# Patient Record
Sex: Male | Born: 1985 | Race: Black or African American | Hispanic: No | Marital: Single | Smoking: Former smoker
Health system: Southern US, Community
[De-identification: ages and names within clinical notes are randomized; demographics above are authoritative.]

## PROBLEM LIST (undated history)

## (undated) DIAGNOSIS — F1910 Other psychoactive substance abuse, uncomplicated: Secondary | ICD-10-CM

## (undated) HISTORY — PX: EYE SURGERY: SHX253

---
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
## 2003-05-23 ENCOUNTER — Emergency Department (HOSPITAL_COMMUNITY): Admission: EM | Admit: 2003-05-23 | Discharge: 2003-05-23 | Payer: Self-pay | Admitting: Emergency Medicine

## 2003-06-01 ENCOUNTER — Emergency Department (HOSPITAL_COMMUNITY): Admission: AD | Admit: 2003-06-01 | Discharge: 2003-06-01 | Payer: Self-pay | Admitting: Family Medicine

## 2003-07-23 ENCOUNTER — Emergency Department (HOSPITAL_COMMUNITY): Admission: EM | Admit: 2003-07-23 | Discharge: 2003-07-23 | Payer: Self-pay | Admitting: Emergency Medicine

## 2003-10-12 ENCOUNTER — Emergency Department (HOSPITAL_COMMUNITY): Admission: EM | Admit: 2003-10-12 | Discharge: 2003-10-12 | Payer: Self-pay | Admitting: Emergency Medicine

## 2003-11-02 ENCOUNTER — Emergency Department (HOSPITAL_COMMUNITY): Admission: EM | Admit: 2003-11-02 | Discharge: 2003-11-02 | Payer: Self-pay | Admitting: Family Medicine

## 2003-12-25 ENCOUNTER — Emergency Department (HOSPITAL_COMMUNITY): Admission: EM | Admit: 2003-12-25 | Discharge: 2003-12-25 | Payer: Self-pay | Admitting: Family Medicine

## 2004-02-15 ENCOUNTER — Emergency Department (HOSPITAL_COMMUNITY): Admission: EM | Admit: 2004-02-15 | Discharge: 2004-02-15 | Payer: Self-pay | Admitting: Family Medicine

## 2005-04-13 ENCOUNTER — Emergency Department (HOSPITAL_COMMUNITY): Admission: EM | Admit: 2005-04-13 | Discharge: 2005-04-13 | Payer: Self-pay | Admitting: Family Medicine

## 2005-07-01 ENCOUNTER — Inpatient Hospital Stay (HOSPITAL_COMMUNITY): Admission: AC | Admit: 2005-07-01 | Discharge: 2005-07-08 | Payer: Self-pay

## 2005-07-13 ENCOUNTER — Emergency Department (HOSPITAL_COMMUNITY): Admission: EM | Admit: 2005-07-13 | Discharge: 2005-07-14 | Payer: Self-pay | Admitting: Emergency Medicine

## 2005-07-21 ENCOUNTER — Emergency Department (HOSPITAL_COMMUNITY): Admission: EM | Admit: 2005-07-21 | Discharge: 2005-07-21 | Payer: Self-pay | Admitting: Family Medicine

## 2005-07-22 ENCOUNTER — Inpatient Hospital Stay (HOSPITAL_COMMUNITY): Admission: EM | Admit: 2005-07-22 | Discharge: 2005-07-30 | Payer: Self-pay | Admitting: Emergency Medicine

## 2005-08-09 ENCOUNTER — Encounter: Admission: RE | Admit: 2005-08-09 | Discharge: 2005-08-09 | Payer: Self-pay | Admitting: Thoracic Surgery

## 2007-07-01 IMAGING — CR DG CHEST 2V
2 series · 2 of 2 positions shown · non-contrast
Comparison: 07/14/05.

CLINICAL DATA: Short of breath. Chest pain. Difficulty breathing.
 CHEST - 2 VIEW:

[w chest pa]
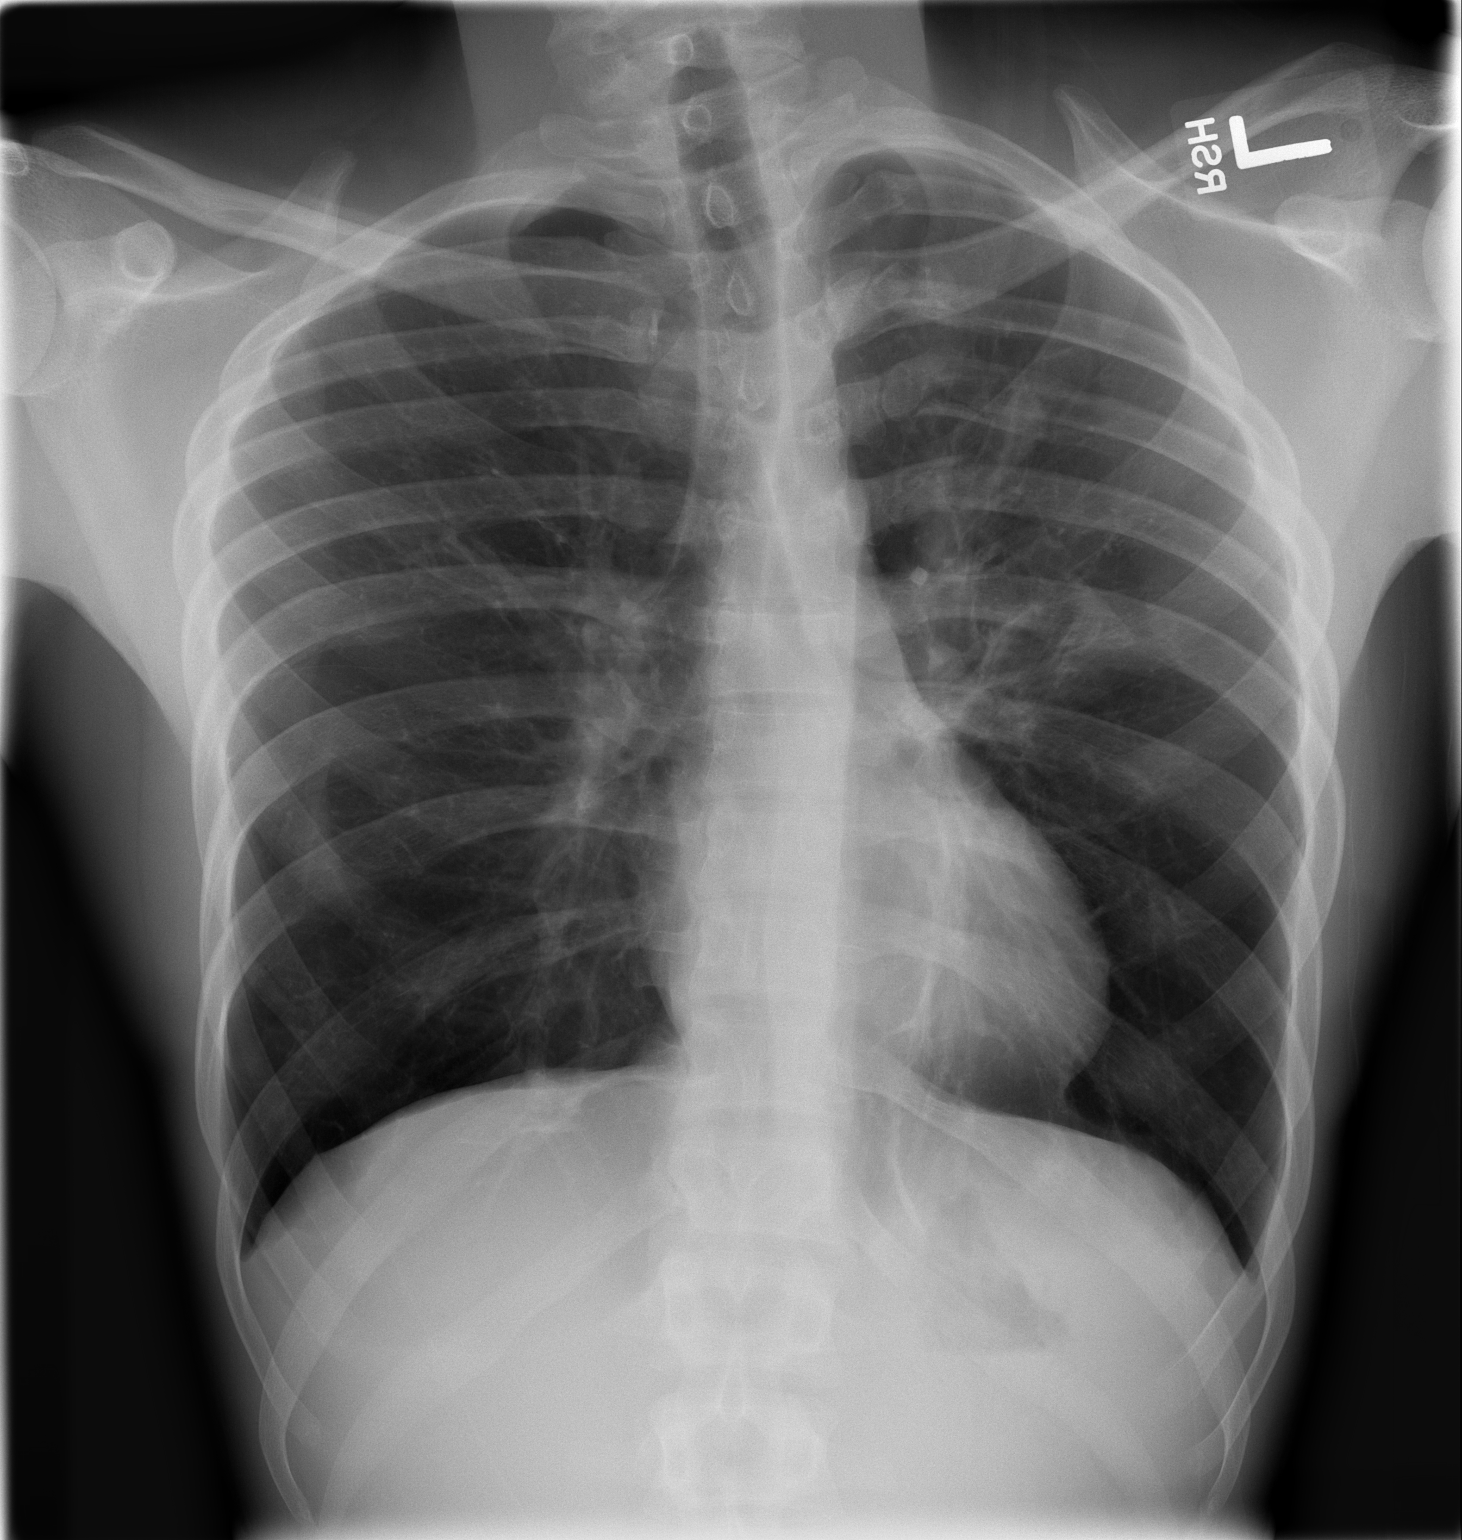

[w chest lat]
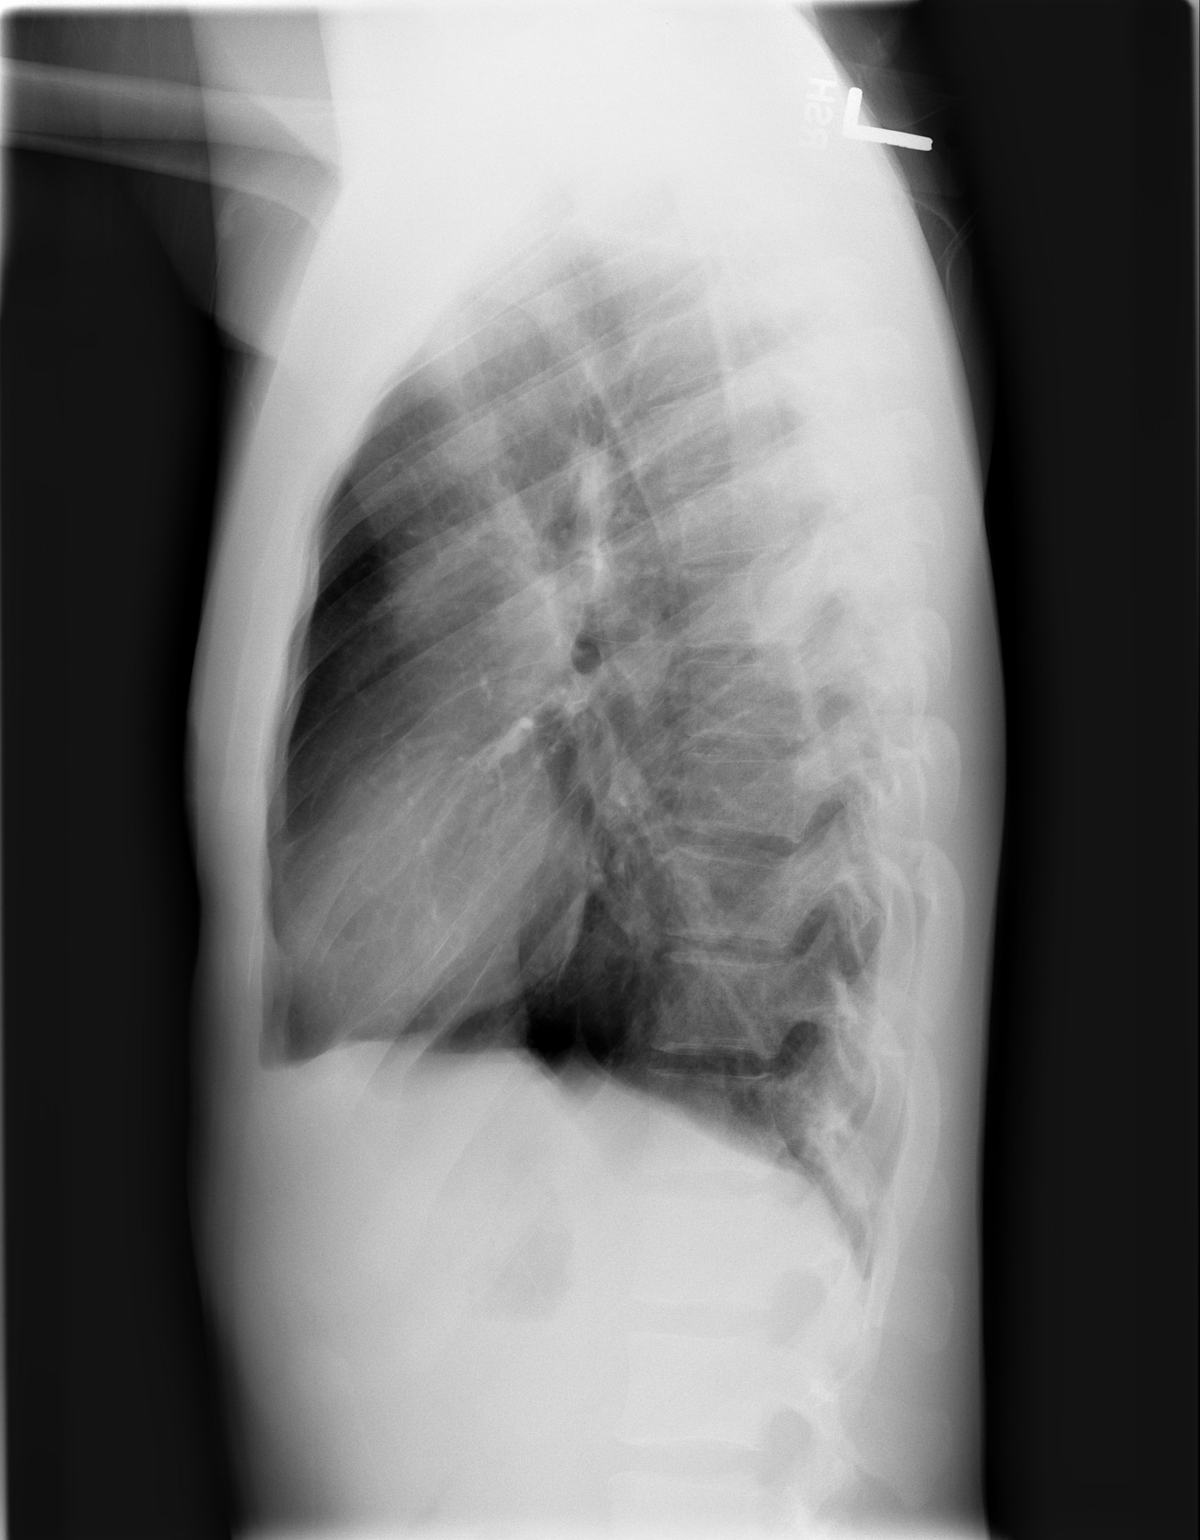

[2 of 2 positions shown; findings below may reference images not displayed]

FINDINGS: A cavitary density in the superior segment of the left lower lobe has improved and was likely an area of pneumonia.  There also is interval clearing of a small area of right lower lobe infiltrate.  There is chronic lung disease with findings suggestive of asthma.  No effusion is seen.
IMPRESSION: Clearing of right lower lobe pneumonia.
 Partial clearing of left lower lobe cavitary pneumonia.  Continued follow-up until clearing is suggested.

## 2008-01-24 ENCOUNTER — Inpatient Hospital Stay (HOSPITAL_COMMUNITY): Admission: EM | Admit: 2008-01-24 | Discharge: 2008-01-26 | Payer: Self-pay | Admitting: *Deleted

## 2008-04-11 ENCOUNTER — Emergency Department (HOSPITAL_COMMUNITY): Admission: EM | Admit: 2008-04-11 | Discharge: 2008-04-11 | Payer: Self-pay | Admitting: Emergency Medicine
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
## 2011-02-19 ENCOUNTER — Emergency Department (HOSPITAL_COMMUNITY)
Admission: EM | Admit: 2011-02-19 | Discharge: 2011-02-20 | Disposition: A | Discharge status: Home / Self Care | Payer: No Typology Code available for payment source | Attending: Emergency Medicine | Admitting: Emergency Medicine

## 2011-02-19 DIAGNOSIS — R079 Chest pain, unspecified: Secondary | ICD-10-CM | POA: Insufficient documentation

## 2011-02-19 DIAGNOSIS — T148XXA Other injury of unspecified body region, initial encounter: Secondary | ICD-10-CM | POA: Insufficient documentation

## 2011-02-19 DIAGNOSIS — Y92410 Unspecified street and highway as the place of occurrence of the external cause: Secondary | ICD-10-CM | POA: Insufficient documentation

## 2011-02-19 DIAGNOSIS — M542 Cervicalgia: Secondary | ICD-10-CM | POA: Insufficient documentation

## 2011-02-19 DIAGNOSIS — M25519 Pain in unspecified shoulder: Secondary | ICD-10-CM | POA: Insufficient documentation

## 2011-02-20 ENCOUNTER — Emergency Department (HOSPITAL_COMMUNITY): Payer: No Typology Code available for payment source

## 2011-02-20 ENCOUNTER — Emergency Department (HOSPITAL_COMMUNITY)
Admission: EM | Admit: 2011-02-20 | Discharge: 2011-02-20 | Disposition: A | Discharge status: Home / Self Care | Payer: No Typology Code available for payment source | Attending: Emergency Medicine | Admitting: Emergency Medicine

## 2011-02-20 ENCOUNTER — Emergency Department (HOSPITAL_COMMUNITY): Payer: Self-pay

## 2011-02-20 DIAGNOSIS — R51 Headache: Secondary | ICD-10-CM | POA: Insufficient documentation

## 2011-02-20 DIAGNOSIS — M545 Low back pain, unspecified: Secondary | ICD-10-CM | POA: Insufficient documentation

## 2011-02-20 DIAGNOSIS — Z09 Encounter for follow-up examination after completed treatment for conditions other than malignant neoplasm: Secondary | ICD-10-CM | POA: Insufficient documentation

## 2011-02-20 DIAGNOSIS — S335XXA Sprain of ligaments of lumbar spine, initial encounter: Secondary | ICD-10-CM | POA: Insufficient documentation

## 2011-02-22 LAB — GC/CHLAMYDIA PROBE AMP, GENITAL
Chlamydia, DNA Probe: NEGATIVE
GC Probe Amp, Genital: NEGATIVE

## 2011-02-23 LAB — POCT I-STAT, CHEM 8
Calcium, Ion: 1.08 — ABNORMAL LOW
Chloride: 103
Creatinine, Ser: 1.1
Potassium: 3.1 — ABNORMAL LOW

## 2011-02-23 LAB — DIFFERENTIAL
Basophils Absolute: 0
Basophils Relative: 0
Eosinophils Absolute: 0.3
Eosinophils Relative: 4
Lymphocytes Relative: 14
Monocytes Absolute: 0.4
Monocytes Relative: 6

## 2011-02-23 LAB — EXPECTORATED SPUTUM ASSESSMENT W GRAM STAIN, RFLX TO RESP C

## 2011-02-23 LAB — RAPID URINE DRUG SCREEN, HOSP PERFORMED
Benzodiazepines: NOT DETECTED
Cocaine: NOT DETECTED
Opiates: NOT DETECTED

## 2011-02-23 LAB — CULTURE, BLOOD (ROUTINE X 2): Culture: NO GROWTH

## 2011-02-23 LAB — COMPREHENSIVE METABOLIC PANEL
AST: 30
Albumin: 3.5
BUN: 12
Creatinine, Ser: 0.9
GFR calc Af Amer: >60
Potassium: 4.3
Total Protein: 6.6

## 2011-02-23 LAB — CBC
Hemoglobin: 14.8
Hemoglobin: 16.5
Platelets: 118 — ABNORMAL LOW
RBC: 5.03
RBC: 5.55
WBC: 6.4

## 2011-02-23 LAB — URINALYSIS, MICROSCOPIC ONLY
Glucose, UA: NEGATIVE
Leukocytes, UA: NEGATIVE
Specific Gravity, Urine: 1.028
Urobilinogen, UA: 0.2
pH: 7

## 2011-02-23 LAB — CULTURE, RESPIRATORY W GRAM STAIN: Culture: NORMAL

## 2011-02-23 LAB — LEGIONELLA ANTIGEN, URINE: Legionella Antigen, Urine: NEGATIVE

## 2011-02-23 LAB — URINE CULTURE: Colony Count: NO GROWTH

## 2011-08-25 ENCOUNTER — Emergency Department (INDEPENDENT_AMBULATORY_CARE_PROVIDER_SITE_OTHER)
Admission: EM | Admit: 2011-08-25 | Discharge: 2011-08-25 | Disposition: A | Discharge status: Home Health | Payer: Self-pay | Source: Home / Self Care | Attending: Emergency Medicine | Admitting: Emergency Medicine

## 2011-08-25 ENCOUNTER — Encounter (HOSPITAL_COMMUNITY): Payer: Self-pay | Admitting: *Deleted

## 2011-08-25 DIAGNOSIS — L03039 Cellulitis of unspecified toe: Secondary | ICD-10-CM

## 2011-08-25 MED ORDER — DOXYCYCLINE HYCLATE 100 MG PO CAPS: 100.0000 mg | ORAL_CAPSULE | Freq: Two times a day (BID) | ORAL | Status: AC

## 2011-08-25 NOTE — Discharge Instructions (Signed)
  As discussed for the next 24-48 hours soak affected toe in soapy luke water (use antibacterial soaps such as fever or dial) no improvement after that and tenderness remains the same or worsen start with provided antibiotic    Paronychia Paronychia is an inflammatory reaction involving the folds of the skin surrounding the fingernail. This is commonly caused by an infection in the skin around a nail. The most common cause of paronychia is frequent wetting of the hands (as seen with bartenders, food servers, nurses or others who wet their hands). This makes the skin around the fingernail susceptible to infection by bacteria (germs) or fungus. Other predisposing factors are:  Aggressive manicuring.   Nail biting.   Thumb sucking.  The most common cause is a staphylococcal (a type of germ) infection, or a fungal (Candida) infection. When caused by a germ, it usually comes on suddenly with redness, swelling, pus and is often painful. It may get under the nail and form an abscess (collection of pus), or form an abscess around the nail. If the nail itself is infected with a fungus, the treatment is usually prolonged and may require oral medicine for up to one year. Your caregiver will determine the length of time treatment is required. The paronychia caused by bacteria (germs) may largely be avoided by not pulling on hangnails or picking at cuticles. When the infection occurs at the tips of the finger it is called felon. When the cause of paronychia is from the herpes simplex virus (HSV) it is called herpetic whitlow. TREATMENT  When an abscess is present treatment is often incision and drainage. This means that the abscess must be cut open so the pus can get out. When this is done, the following home care instructions should be followed. HOME CARE INSTRUCTIONS   It is important to keep the affected fingers very dry. Rubber or plastic gloves over cotton gloves should be used whenever the hand must be  placed in water.   Keep wound clean, dry and dressed as suggested by your caregiver between warm soaks or warm compresses.   Soak in warm water for fifteen to twenty minutes three to four times per day for bacterial infections. Fungal infections are very difficult to treat, so often require treatment for long periods of time.   For bacterial (germ) infections take antibiotics (medicine which kill germs) as directed and finish the prescription, even if the problem appears to be solved before the medicine is gone.   Only take over-the-counter or prescription medicines for pain, discomfort, or fever as directed by your caregiver.  SEEK IMMEDIATE MEDICAL CARE IF:  You have redness, swelling, or increasing pain in the wound.   You notice pus coming from the wound.   You have a fever.   You notice a bad smell coming from the wound or dressing.  Document Released: 11/02/2000 Document Revised: 04/28/2011 Document Reviewed: 07/04/2008 [facility] Patient Information 2012 [place], [place].

## 2011-08-25 NOTE — ED Notes (Signed)
Pt reports 2 days of pain in his left great toe.  He used a toenail clippers to try to work on an ingrown nail

## 2011-08-25 NOTE — ED Provider Notes (Signed)
History     CSN: [id number]  Arrival date & time 08/25/11  1002   First MD Initiated Contact with Patient 08/25/11 1043      Chief Complaint  Patient presents with  . Toe Pain    (Consider location/radiation/quality/duration/timing/severity/associated sxs/prior treatment) HPI Comments: I have had an ingrown toenail on left great toe for a week or so I did use some clippers and did get being called part of the nail out. But it got infected a sore red and with discharge and is hurting.  Patient is a 26 y.o. male presenting with toe pain. The history is provided by the patient.  Toe Pain This is a new problem. The current episode started 2 days ago. The problem occurs constantly. The problem has not changed since onset.Pertinent negatives include no abdominal pain and no headaches.    Past Medical History  Diagnosis Date  . Asthma     History reviewed. No pertinent past surgical history.  History reviewed. No pertinent family history.  History  Substance Use Topics  . Smoking status: Former Smoker    Quit date: 09/17/2008  . Smokeless tobacco: Not on file  . Alcohol Use: No      Review of Systems  Constitutional: Negative for fever.  Gastrointestinal: Negative for abdominal pain.  Skin: Positive for wound. Negative for rash.  Neurological: Negative for syncope, numbness and headaches.    Allergies  Review of patient's allergies indicates no known allergies.  Home Medications   Current Outpatient Rx  Name Route Sig Dispense Refill  . ALBUTEROL SULFATE HFA 108 (90 BASE) MCG/ACT IN AERS Inhalation Inhale 2 puffs into the lungs every 6 (six) hours as needed.    Marland Kitchen DOXYCYCLINE HYCLATE 100 MG PO CAPS Oral Take 1 capsule (100 mg total) by mouth 2 (two) times daily. 20 capsule 0    BP 117/59  Pulse 76  Temp(Src) 97.1 F (36.2 C) (Oral)  Resp 12  SpO2 96%  Physical Exam  Vitals reviewed. Constitutional: He appears well-developed and well-nourished.  Eyes:  Conjunctivae are normal.  Abdominal: There is tenderness.  Musculoskeletal: He exhibits tenderness.       Feet:  Neurological: He is alert.  Skin: Skin is warm. Rash noted. There is erythema.    ED Course  Procedures (including critical care time)  Labs Reviewed - No data to display No results found.   1. Paronychia of toe       MDM  Self resolve toe ingrown with a secondarily infected subungueal       Jimmie Molly, MD 08/25/11 1121

## 2011-09-12 ENCOUNTER — Emergency Department (HOSPITAL_COMMUNITY)
Admission: EM | Admit: 2011-09-12 | Discharge: 2011-09-12 | Disposition: A | Discharge status: Home / Self Care | Payer: Self-pay | Attending: Emergency Medicine | Admitting: Emergency Medicine

## 2011-09-12 ENCOUNTER — Encounter (HOSPITAL_COMMUNITY): Payer: Self-pay | Admitting: *Deleted

## 2011-09-12 DIAGNOSIS — L03039 Cellulitis of unspecified toe: Secondary | ICD-10-CM | POA: Insufficient documentation

## 2011-09-12 DIAGNOSIS — L600 Ingrowing nail: Secondary | ICD-10-CM | POA: Insufficient documentation

## 2011-09-12 DIAGNOSIS — M79609 Pain in unspecified limb: Secondary | ICD-10-CM | POA: Insufficient documentation

## 2011-09-12 DIAGNOSIS — R229 Localized swelling, mass and lump, unspecified: Secondary | ICD-10-CM | POA: Insufficient documentation

## 2011-09-12 DIAGNOSIS — L03032 Cellulitis of left toe: Secondary | ICD-10-CM

## 2011-09-12 MED ORDER — IBUPROFEN 600 MG PO TABS: 600.0000 mg | ORAL_TABLET | Freq: Three times a day (TID) | ORAL | Status: AC | PRN

## 2011-09-12 MED ORDER — HYDROCODONE-ACETAMINOPHEN 5-500 MG PO TABS: 1.0000 | ORAL_TABLET | Freq: Four times a day (QID) | ORAL | Status: AC | PRN

## 2011-09-12 NOTE — ED Provider Notes (Signed)
History     CSN: [id number]  Arrival date & time 09/12/11  1810   First MD Initiated Contact with Patient 09/12/11 1852      Chief Complaint  Patient presents with  . Wound Infection    (Consider location/radiation/quality/duration/timing/severity/associated sxs/prior treatment) The history is provided by the patient.   the patient reports is at an ingrown left great toenail for approximately a week.  Was seen in urgent care and was told that it would improve.  Today he's noticed a foul-smelling white drainage coming from this area in his had some swelling on the lateral aspect.  His pain is mild to moderate.  His pain is worsened by palpation.  He denies fevers or chills or spreading redness.  Past Medical History  Diagnosis Date  . Asthma     History reviewed. No pertinent past surgical history.  History reviewed. No pertinent family history.  History  Substance Use Topics  . Smoking status: Former Smoker    Quit date: 09/17/2008  . Smokeless tobacco: Not on file  . Alcohol Use: No      Review of Systems  All other systems reviewed and are negative.    Allergies  Review of patient's allergies indicates no known allergies.  Home Medications  No current outpatient prescriptions on file.  BP 139/83  Pulse 76  Temp(Src) 97.8 F (36.6 C) (Oral)  Resp 16  SpO2 97%  Physical Exam  Nursing note and vitals reviewed. Constitutional: He is oriented to person, place, and time. He appears well-developed and well-nourished.  HENT:  Head: Normocephalic and atraumatic.  Eyes: EOM are normal.  Neck: Normal range of motion.  Cardiovascular: Normal rate.   Pulmonary/Chest: Effort normal.  Musculoskeletal: Normal range of motion.       Ingrown left great toe toenail with mild lateral paronychia and drainage of pus.  Neurological: He is alert and oriented to person, place, and time.  Skin: Skin is warm and dry.  Psychiatric: He has a normal mood and affect. Judgment  normal.    ED Course  NAIL REMOVAL Performed by: Lyanne Co Authorized by: Lyanne Co Consent: Verbal consent obtained. Risks and benefits: risks, benefits and alternatives were discussed Consent given by: patient Required items: required blood products, implants, devices, and special equipment available Patient identity confirmed: verbally with patient Time out: Immediately prior to procedure a "time out" was called to verify the correct patient, procedure, equipment, support staff and site/side marked as required. Location: left foot Location details: left big toe Anesthesia: digital block (see note) Local anesthetic: lidocaine 1% without epinephrine Preparation: skin prepped with Betadine Amount removed: 1/5 Nail removed location: lateral. Nail matrix removed: partial Dressing: antibiotic ointment Patient tolerance: Patient tolerated the procedure well with no immediate complications.  NERVE BLOCK Performed by: Lyanne Co Authorized by: Lyanne Co Consent: Verbal consent obtained. Risks and benefits: risks, benefits and alternatives were discussed Consent given by: patient Required items: required blood products, implants, devices, and special equipment available Patient identity confirmed: verbally with patient Time out: Immediately prior to procedure a "time out" was called to verify the correct patient, procedure, equipment, support staff and site/side marked as required. Indications comments: ingrown toenail procedure\ Nerve block body site: left great toe ditial nerves. Preparation: Patient was prepped and draped in the usual sterile fashion. Needle gauge: 25 G Location technique: anatomical landmarks Local anesthetic: lidocaine 1% without epinephrine Anesthetic total: 4 ml Outcome: pain improved Patient tolerance: Patient tolerated the procedure well  with no immediate complications.   (including critical care time)  Labs Reviewed - No data to  display No results found.   1. Ingrown left big toenail   2. Paronychia of great toe, left       MDM  Infected left ingrown great toe toenail.  Toenail portion of his ingrown was removed.  The patient a digital block performed.  This area was cleaned.  There is no indication for antibiotics.  All pus was removed.  The patient will be instructed in dial soap soaks.         Lyanne Co, MD 09/12/11 [phone]

## 2011-09-12 NOTE — Discharge Instructions (Signed)
Infected Ingrown Toenail  An infected ingrown toenail occurs when the nail edge grows into the skin and bacteria invade the area. Symptoms include pain, tenderness, swelling, and pus drainage from the edge of the nail. Poorly fitting shoes, minor injuries, and improper cutting of the toenail may also contribute to the problem. You should cut your toenails squarely instead of rounding the edges. Do not cut them too short. Avoid tight or pointed toe shoes. Sometimes the ingrown portion of the nail must be removed. If your toenail is removed, it can take 3-4 months for it to re-grow.  HOME CARE INSTRUCTIONS     Soak your infected toe in warm water for 20-30 minutes, 2 to 3 times a day.    Packing or dressings applied to the area should be changed daily.    Take medicine as directed and finish them.    Reduce activities and keep your foot elevated when able to reduce swelling and discomfort. Do this until the infection gets better.    Wear sandals or go barefoot as much as possible while the infected area is sensitive.    See your caregiver for follow-up care in 2-3 days if the infection is not better.   SEEK MEDICAL CARE IF:    Your toe is becoming more red, swollen or painful.  MAKE SURE YOU:     Understand these instructions.    Will watch your condition.    Will get help right away if you are not doing well or get worse.   Document Released: 06/16/2004 Document Revised: 04/28/2011 Document Reviewed: 05/05/2008  [employer] Patient Information 2012 [employer].

## 2011-09-12 NOTE — ED Notes (Signed)
To ED for eval of left great toe infection. States he was seen at [facility] after pulling out an ingrown toenail. Noticed white drainage today.

## 2012-09-20 ENCOUNTER — Encounter (HOSPITAL_COMMUNITY): Payer: Self-pay | Admitting: Emergency Medicine

## 2012-09-20 ENCOUNTER — Emergency Department (HOSPITAL_COMMUNITY)
Admission: EM | Admit: 2012-09-20 | Discharge: 2012-09-20 | Disposition: A | Discharge status: Home / Self Care | Payer: Self-pay | Attending: Emergency Medicine | Admitting: Emergency Medicine

## 2012-09-20 DIAGNOSIS — R3989 Other symptoms and signs involving the genitourinary system: Secondary | ICD-10-CM | POA: Insufficient documentation

## 2012-09-20 DIAGNOSIS — J45909 Unspecified asthma, uncomplicated: Secondary | ICD-10-CM | POA: Insufficient documentation

## 2012-09-20 DIAGNOSIS — R399 Unspecified symptoms and signs involving the genitourinary system: Secondary | ICD-10-CM

## 2012-09-20 DIAGNOSIS — Z87891 Personal history of nicotine dependence: Secondary | ICD-10-CM | POA: Insufficient documentation

## 2012-09-20 LAB — GLUCOSE, CAPILLARY: Glucose-Capillary: 90 mg/dL (ref 70–99)

## 2012-09-20 LAB — URINE MICROSCOPIC-ADD ON

## 2012-09-20 LAB — URINALYSIS, ROUTINE W REFLEX MICROSCOPIC
Glucose, UA: NEGATIVE mg/dL
Hgb urine dipstick: NEGATIVE
pH: 7.5 (ref 5.0–8.0)

## 2012-09-20 MED ORDER — CEFTRIAXONE SODIUM 250 MG IJ SOLR
250.0000 mg | Freq: Once | INTRAMUSCULAR | Status: AC
  Administered 2012-09-20: 250 mg via INTRAMUSCULAR
  Filled 2012-09-20: qty 250

## 2012-09-20 MED ORDER — AZITHROMYCIN 250 MG PO TABS
1000.0000 mg | ORAL_TABLET | Freq: Once | ORAL | Status: AC
  Administered 2012-09-20: 1000 mg via ORAL
  Filled 2012-09-20: qty 4

## 2012-09-20 NOTE — ED Provider Notes (Signed)
History     CSN: [id number]  Arrival date & time 09/20/12  4098   First MD Initiated Contact with Patient 09/20/12 [phone]      Chief Complaint  Patient presents with  . Urinary Frequency    (Consider location/radiation/quality/duration/timing/severity/associated sxs/prior treatment) Patient is a 27 y.o. male presenting with frequency. The history is provided by the patient.  Urinary Frequency Pertinent negatives include no abdominal pain.  pt c/o rare/occasion episodes of feeling wet spot on underwear, and states may have urinated on self.  States in general has had normal control of urine. No urgency, dysuria, frequency.  Feels as if is able to urinate normal amount and empty bladder. No penile pain or discharge. No scrotal or testicular pain. No perineal or saddle area numbness, no leg numbness/weakness. No back pain. No abd or flank pain. No fever or chills. Normal appetite. Does not feel sick or ill.   Past Medical History  Diagnosis Date  . Asthma     History reviewed. No pertinent past surgical history.  No family history on file.  History  Substance Use Topics  . Smoking status: Former Smoker    Quit date: 09/17/2008  . Smokeless tobacco: Not on file  . Alcohol Use: Yes     Comment: occasionally      Review of Systems  Constitutional: Negative for fever and chills.  Gastrointestinal: Negative for nausea, vomiting, abdominal pain, diarrhea, constipation and abdominal distention.  Endocrine: Negative for polyuria.  Genitourinary: Negative for dysuria, hematuria, flank pain, penile swelling and penile pain.  Musculoskeletal: Negative for back pain.  Neurological: Negative for weakness and numbness.    Allergies  Review of patient's allergies indicates no known allergies.  Home Medications  No current outpatient prescriptions on file.  BP 140/89  Pulse 58  Temp(Src) 98.1 F (36.7 C) (Oral)  Resp 20  SpO2 95%  Physical Exam  Nursing note and vitals  reviewed. Constitutional: He is oriented to person, place, and time. He appears well-developed and well-nourished. No distress.  HENT:  Mouth/Throat: Oropharynx is clear and moist.  Eyes: Conjunctivae are normal.  Neck: Neck supple. No tracheal deviation present.  Cardiovascular: Normal rate.   Pulmonary/Chest: Effort normal. No accessory muscle usage. No respiratory distress.  Abdominal: Soft. Bowel sounds are normal. He exhibits no distension and no mass. There is no tenderness. There is no rebound and no guarding.  Genitourinary:  No cva tenderness. Normal external genitalia. No penile discharge. No scrotal or testicular pain, swelling, or tenderness.   Musculoskeletal: Normal range of motion.  Neurological: He is alert and oriented to person, place, and time.  Skin: Skin is warm and dry.  Psychiatric: He has a normal mood and affect.    ED Course  Procedures (including critical care time)  Results for orders placed during the hospital encounter of 09/20/12  URINALYSIS, ROUTINE W REFLEX MICROSCOPIC      Result Value Range   Color, Urine YELLOW  YELLOW   APPearance CLEAR  CLEAR   Specific Gravity, Urine 1.023  1.005 - 1.030   pH 7.5  5.0 - 8.0   Glucose, UA NEGATIVE  NEGATIVE mg/dL   Hgb urine dipstick NEGATIVE  NEGATIVE   Bilirubin Urine NEGATIVE  NEGATIVE   Ketones, ur NEGATIVE  NEGATIVE mg/dL   Protein, ur NEGATIVE  NEGATIVE mg/dL   Urobilinogen, UA 0.2  0.0 - 1.0 mg/dL   Nitrite NEGATIVE  NEGATIVE   Leukocytes, UA SMALL (*) NEGATIVE  GLUCOSE, CAPILLARY  Result Value Range   Glucose-Capillary 90  70 - 99 mg/dL   Comment 1 Notify RN    URINE MICROSCOPIC-ADD ON      Result Value Range   Squamous Epithelial / LPF RARE  RARE   WBC, UA 3-6  <3 WBC/hpf   Bacteria, UA FEW (*) RARE        MDM  Labs.  Reviewed nursing notes and prior charts for additional history.    Pt notes prior hx gc.  Given symptoms, although atypical, few wbc/bact in urine, will rx  pending gc/chlam cx.  Confirmed nkda. Rocephin im. zithromax po.        Suzi Roots, MD 09/20/12 [phone]

## 2012-09-20 NOTE — ED Notes (Signed)
Pt states that for about 1 1/2 weeks feels as if he has been urinating on his self. States he doesn't feel like he has to go but when he feels his pants in that area its wet. Average about 2-3 times a day on every other day. Denies pain or other urinary symptoms.

## 2012-09-21 LAB — URINE CULTURE
Colony Count: NO GROWTH
Culture: NO GROWTH

## 2012-09-21 LAB — GC/CHLAMYDIA PROBE AMP
CT Probe RNA: NEGATIVE
GC Probe RNA: NEGATIVE

## 2013-06-22 ENCOUNTER — Emergency Department (HOSPITAL_COMMUNITY)
Admission: EM | Admit: 2013-06-22 | Discharge: 2013-06-22 | Disposition: A | Discharge status: Home / Self Care | Payer: Self-pay | Attending: Emergency Medicine | Admitting: Emergency Medicine

## 2013-06-22 ENCOUNTER — Encounter (HOSPITAL_COMMUNITY): Payer: Self-pay | Admitting: Emergency Medicine

## 2013-06-22 ENCOUNTER — Emergency Department (HOSPITAL_COMMUNITY): Payer: Self-pay

## 2013-06-22 DIAGNOSIS — Z79899 Other long term (current) drug therapy: Secondary | ICD-10-CM | POA: Insufficient documentation

## 2013-06-22 DIAGNOSIS — J45909 Unspecified asthma, uncomplicated: Secondary | ICD-10-CM

## 2013-06-22 DIAGNOSIS — Z87891 Personal history of nicotine dependence: Secondary | ICD-10-CM | POA: Insufficient documentation

## 2013-06-22 DIAGNOSIS — J45901 Unspecified asthma with (acute) exacerbation: Secondary | ICD-10-CM | POA: Insufficient documentation

## 2013-06-22 DIAGNOSIS — M549 Dorsalgia, unspecified: Secondary | ICD-10-CM | POA: Insufficient documentation

## 2013-06-22 MED ORDER — ALBUTEROL SULFATE HFA 108 (90 BASE) MCG/ACT IN AERS: 2.0000 | INHALATION_SPRAY | RESPIRATORY_TRACT | Status: DC | PRN

## 2013-06-22 MED ORDER — PREDNISONE 20 MG PO TABS
60.0000 mg | ORAL_TABLET | Freq: Once | ORAL | Status: AC
  Administered 2013-06-22: 60 mg via ORAL

## 2013-06-22 MED ORDER — PREDNISONE 20 MG PO TABS
60.0000 mg | ORAL_TABLET | Freq: Every day | ORAL | Status: DC
  Filled 2013-06-22: qty 3

## 2013-06-22 MED ORDER — IPRATROPIUM-ALBUTEROL 0.5-2.5 (3) MG/3ML IN SOLN
3.0000 mL | Freq: Once | RESPIRATORY_TRACT | Status: AC
  Administered 2013-06-22: 3 mL via RESPIRATORY_TRACT
  Filled 2013-06-22: qty 3

## 2013-06-22 MED ORDER — PREDNISONE 10 MG PO TABS: ORAL_TABLET | ORAL | Status: DC

## 2013-06-22 NOTE — ED Notes (Signed)
Patient with Hx of asthma reports to ED for SOB without relief from inhaler, states he wakes up "choking" at night, requires more frequent use of inhaler. Pt c/o generalized body aches, most concentrated in the lumbar area. C/O cough, blowing nose with trace amounts of blood in a yellow mucus. Pt states he had cold last week with fever of 101 degrees F.

## 2013-06-22 NOTE — Discharge Instructions (Signed)
Asthma, Adult Asthma is a recurring condition in which the airways tighten and narrow. Asthma can make it difficult to breathe. It can cause coughing, wheezing, and shortness of breath. Asthma episodes (also called asthma attacks) range from minor to life-threatening. Asthma cannot be cured, but medicines and lifestyle changes can help control it. CAUSES Asthma is believed to be caused by inherited (genetic) and environmental factors, but its exact cause is unknown. Asthma may be triggered by allergens, lung infections, or irritants in the air. Asthma triggers are different for each person. Common triggers include:   Animal dander.  Dust mites.  Cockroaches.  Pollen from trees or grass.  Mold.  Smoke.  Air pollutants such as dust, household cleaners, hair sprays, aerosol sprays, paint fumes, strong chemicals, or strong odors.  Cold air, weather changes, and winds (which increase molds and pollens in the air).  Strong emotional expressions such as crying or laughing hard.  Stress.  Certain medicines (such as aspirin) or types of drugs (such as beta-blockers).  Sulfites in foods and drinks. Foods and drinks that may contain sulfites include dried fruit, potato chips, and sparkling grape juice.  Infections or inflammatory conditions such as the flu, a cold, or an inflammation of the nasal membranes (rhinitis).  Gastroesophageal reflux disease (GERD).  Exercise or strenuous activity. SYMPTOMS Symptoms may occur immediately after asthma is triggered or many hours later. Symptoms include:  Wheezing.  Excessive nighttime or early morning coughing.  Frequent or severe coughing with a common cold.  Chest tightness.  Shortness of breath. DIAGNOSIS  The diagnosis of asthma is made by a review of your medical history and a physical exam. Tests may also be performed. These may include:  Lung function studies. These tests show how much air you breath in and out.  Allergy  tests.  Imaging tests such as X-rays. TREATMENT  Asthma cannot be cured, but it can usually be controlled. Treatment involves identifying and avoiding your asthma triggers. It also involves medicines. There are 2 classes of medicine used for asthma treatment:   Controller medicines. These prevent asthma symptoms from occurring. They are usually taken every day.  Reliever or rescue medicines. These quickly relieve asthma symptoms. They are used as needed and provide short-term relief. Your health care provider will help you create an asthma action plan. An asthma action plan is a written plan for managing and treating your asthma attacks. It includes a list of your asthma triggers and how they may be avoided. It also includes information on when medicines should be taken and when their dosage should be changed. An action plan may also involve the use of a device called a peak flow meter. A peak flow meter measures how well the lungs are working. It helps you monitor your condition. HOME CARE INSTRUCTIONS   Take medicine as directed by your health care provider. Speak with your health care provider if you have questions about how or when to take the medicines.  Use a peak flow meter as directed by your health care provider. Record and keep track of readings.  Understand and use the action plan to help minimize or stop an asthma attack without needing to seek medical care.  Control your home environment in the following ways to help prevent asthma attacks:  Do not smoke. Avoid being exposed to secondhand smoke.  Change your heating and air conditioning filter regularly.  Limit your use of fireplaces and wood stoves.  Get rid of pests (such as roaches and   mice) and their droppings.  Throw away plants if you see mold on them.  Clean your floors and dust regularly. Use unscented cleaning products.  Try to have someone else vacuum for you regularly. Stay out of rooms while they are being  vacuumed and for a short while afterward. If you vacuum, use a dust mask from a hardware store, a double-layered or microfilter vacuum cleaner bag, or a vacuum cleaner with a HEPA filter.  Replace carpet with wood, tile, or vinyl flooring. Carpet can trap dander and dust.  Use allergy-proof pillows, mattress covers, and box spring covers.  Wash bed sheets and blankets every week in hot water and dry them in a dryer.  Use blankets that are made of polyester or cotton.  Clean bathrooms and kitchens with bleach. If possible, have someone repaint the walls in these rooms with mold-resistant paint. Keep out of the rooms that are being cleaned and painted.  Wash hands frequently. SEEK MEDICAL CARE IF:   You have wheezing, shortness of breath, or a cough even if taking medicine to prevent attacks.  The colored mucus you cough up (sputum) is thicker than usual.  Your sputum changes from clear or white to yellow, green, gray, or bloody.  You have any problems that may be related to the medicines you are taking (such as a rash, itching, swelling, or trouble breathing).  You are using a reliever medicine more than 2 3 times per week.  Your peak flow is still at 50 79% of you personal best after following your action plan for 1 hour. SEEK IMMEDIATE MEDICAL CARE IF:   You seem to be getting worse and are unresponsive to treatment during an asthma attack.  You are short of breath even at rest.  You get short of breath when doing very little physical activity.  You have difficulty eating, drinking, or talking due to asthma symptoms.  You develop chest pain.  You develop a fast heartbeat.  You have a bluish color to your lips or fingernails.  You are lightheaded, dizzy, or faint.  Your peak flow is less than 50% of your personal best.  You have a fever or persistent symptoms for more than 2 3 days.  You have a fever and symptoms suddenly get worse. MAKE SURE YOU:   Understand these  instructions.  Will watch your condition.  Will get help right away if you are not doing well or get worse. Document Released: 05/09/2005 Document Revised: 01/09/2013 Document Reviewed: 12/06/2012 [employer] Patient Information 2014 [employer].  

## 2013-06-22 NOTE — ED Provider Notes (Signed)
CSN: [id number]     Arrival date & time 06/22/13  1652 History   First MD Initiated Contact with Patient 06/22/13 1703     Chief Complaint  Patient presents with  . Shortness of Breath  . Back Pain   (Consider location/radiation/quality/duration/timing/severity/associated sxs/prior Treatment) Patient is a 28 y.o. male presenting with shortness of breath and back pain. The history is provided by the patient. No language interpreter was used.  Shortness of Breath Severity:  Moderate Onset quality:  Gradual Duration:  1 week Timing:  Constant Progression:  Worsening Chronicity:  New Context: URI   Relieved by:  Nothing Worsened by:  Nothing tried Ineffective treatments:  None tried Associated symptoms: sputum production   Associated symptoms: no chest pain   Risk factors: no hx of PE/DVT and no tobacco use   Back Pain Associated symptoms: no chest pain   Pt reports he had a fever of 101 last week.  Continued wheezing this week.   Pt using inhaler frequently without relief  Past Medical History  Diagnosis Date  . Asthma    History reviewed. No pertinent past surgical history. History reviewed. No pertinent family history. History  Substance Use Topics  . Smoking status: Former Smoker    Quit date: 09/17/2008  . Smokeless tobacco: Not on file  . Alcohol Use: Yes     Comment: occasionally    Review of Systems  Respiratory: Positive for sputum production and shortness of breath.   Cardiovascular: Negative for chest pain.  Musculoskeletal: Positive for back pain.  All other systems reviewed and are negative.    Allergies  Review of patient's allergies indicates no known allergies.  Home Medications   Current Outpatient Rx  Name  Route  Sig  Dispense  Refill  . albuterol (PROVENTIL HFA;VENTOLIN HFA) 108 (90 BASE) MCG/ACT inhaler   Inhalation   Inhale 2 puffs into the lungs every 6 (six) hours as needed for wheezing or shortness of breath.          BP 149/78   Pulse 92  Temp(Src) 98.5 F (36.9 C)  Resp 14  SpO2 96% Physical Exam  Nursing note and vitals reviewed. Constitutional: He appears well-developed and well-nourished.  HENT:  Head: Normocephalic.  Right Ear: External ear normal.  Left Ear: External ear normal.  Nose: Nose normal.  Mouth/Throat: Oropharynx is clear and moist.  Eyes: Conjunctivae and EOM are normal. Pupils are equal, round, and reactive to light.  Neck: Normal range of motion. Neck supple.  Cardiovascular: Normal rate and regular rhythm.   Pulmonary/Chest: Effort normal and breath sounds normal.  Abdominal: Soft. Bowel sounds are normal.  Musculoskeletal: Normal range of motion.  Neurological: He is alert.  Skin: Skin is warm.  Psychiatric: He has a normal mood and affect.    ED Course  Procedures (including critical care time) Labs Review Labs Reviewed - No data to display Imaging Review Dg Chest 2 View  06/22/2013   CLINICAL DATA:  Cough, chest pain, congestion for 5 days  EXAM: CHEST  2 VIEW  COMPARISON:  None.  FINDINGS: The heart size and mediastinal contours are within normal limits. Both lungs are clear. The visualized skeletal structures are unremarkable.  IMPRESSION: No active cardiopulmonary disease.   Electronically Signed   By: Elige KoHetal  Patel   On: 06/22/2013 17:49    EKG Interpretation   None       MDM   1. Asthma    Pt given albuterol neb. Pt feels better,  Chest xray is normal.  Pt given prednisone here.   Pt given rx for prednisone taper   Elson Areas, PA-C 06/22/13 1825

## 2013-06-22 NOTE — ED Provider Notes (Signed)
Medical screening examination/treatment/procedure(s) were performed by non-physician practitioner and as supervising physician I was immediately available for consultation/collaboration.  EKG Interpretation   None        Telina Kleckley, MD 06/22/13 2130 

## 2013-06-22 NOTE — ED Notes (Signed)
Patient transported to X-ray 

## 2013-08-06 ENCOUNTER — Emergency Department (HOSPITAL_COMMUNITY)
Admission: EM | Admit: 2013-08-06 | Discharge: 2013-08-06 | Disposition: A | Discharge status: Home / Self Care | Payer: Self-pay | Attending: Emergency Medicine | Admitting: Emergency Medicine

## 2013-08-06 ENCOUNTER — Encounter (HOSPITAL_COMMUNITY): Payer: Self-pay | Admitting: Emergency Medicine

## 2013-08-06 DIAGNOSIS — R05 Cough: Secondary | ICD-10-CM | POA: Insufficient documentation

## 2013-08-06 DIAGNOSIS — Z79899 Other long term (current) drug therapy: Secondary | ICD-10-CM | POA: Insufficient documentation

## 2013-08-06 DIAGNOSIS — J039 Acute tonsillitis, unspecified: Secondary | ICD-10-CM

## 2013-08-06 DIAGNOSIS — Z87891 Personal history of nicotine dependence: Secondary | ICD-10-CM | POA: Insufficient documentation

## 2013-08-06 DIAGNOSIS — J45909 Unspecified asthma, uncomplicated: Secondary | ICD-10-CM | POA: Insufficient documentation

## 2013-08-06 DIAGNOSIS — R059 Cough, unspecified: Secondary | ICD-10-CM | POA: Insufficient documentation

## 2013-08-06 DIAGNOSIS — R509 Fever, unspecified: Secondary | ICD-10-CM | POA: Insufficient documentation

## 2013-08-06 DIAGNOSIS — R599 Enlarged lymph nodes, unspecified: Secondary | ICD-10-CM | POA: Insufficient documentation

## 2013-08-06 MED ORDER — AMOXICILLIN 500 MG PO CAPS: 500.0000 mg | ORAL_CAPSULE | Freq: Three times a day (TID) | ORAL | Status: DC

## 2013-08-06 MED ORDER — TRAMADOL HCL 50 MG PO TABS: 50.0000 mg | ORAL_TABLET | Freq: Four times a day (QID) | ORAL | Status: DC | PRN

## 2013-08-06 NOTE — Progress Notes (Signed)
[facility] provided pt with a list of primary care resources, dental resources, and a [facility] [employer] Card application to help patient establish primary care.  °

## 2013-08-06 NOTE — ED Notes (Signed)
Pt c/o pain and tenderness in throat and roof of mouth x 4 days

## 2013-08-06 NOTE — ED Provider Notes (Signed)
Medical screening examination/treatment/procedure(s) were performed by non-physician practitioner and as supervising physician I was immediately available for consultation/collaboration.  Manasseh Pittsley T Irlene Crudup, MD 08/06/13 1512 

## 2013-08-06 NOTE — ED Provider Notes (Signed)
CSN: [id number]     Arrival date & time 08/06/13  1044 History   First MD Initiated Contact with Patient 08/06/13 1112     No chief complaint on file.    (Consider location/radiation/quality/duration/timing/severity/associated sxs/prior Treatment) HPI Comments: Patient is a 28 year old male who presents with a 3 day history of sore throat. Patient reports gradual onset and progressively worsening sharp, severe throat pain. The pain is constant and made worse with swallowing. The pain is localized to the patient's throat and equal on both sides. Nothing alleviates the pain. The patient has not tried anything for symptom relief. Patient reports associated subjective fever, cervical adenopathy, and non productive cough. Patient denies headache, visual changes, sinus congestion, difficulty breathing, chest pain, SOB, abdominal pain, NVD.      Past Medical History  Diagnosis Date  . Asthma    No past surgical history on file. No family history on file. History  Substance Use Topics  . Smoking status: Former Smoker    Quit date: 09/17/2008  . Smokeless tobacco: Not on file  . Alcohol Use: Yes     Comment: occasionally    Review of Systems  Constitutional: Negative for fever, chills and fatigue.  HENT: Positive for sore throat. Negative for trouble swallowing.   Eyes: Negative for visual disturbance.  Respiratory: Negative for shortness of breath.   Cardiovascular: Negative for chest pain and palpitations.  Gastrointestinal: Negative for nausea, vomiting, abdominal pain and diarrhea.  Genitourinary: Negative for dysuria and difficulty urinating.  Musculoskeletal: Positive for myalgias. Negative for arthralgias and neck pain.  Skin: Negative for color change.  Neurological: Negative for dizziness and weakness.  Psychiatric/Behavioral: Negative for dysphoric mood.      Allergies  Review of patient's allergies indicates no known allergies.  Home Medications   Current Outpatient  Rx  Name  Route  Sig  Dispense  Refill  . albuterol (PROVENTIL HFA;VENTOLIN HFA) 108 (90 BASE) MCG/ACT inhaler   Inhalation   Inhale 1-2 puffs into the lungs every 6 (six) hours as needed for wheezing or shortness of breath.           BP 132/91  Pulse 90  Temp(Src) 98.7 F (37.1 C) (Oral)  Resp 18  SpO2 98% Physical Exam  Nursing note and vitals reviewed. Constitutional: He appears well-developed and well-nourished. No distress.  HENT:  Head: Normocephalic and atraumatic.  Bilateral tonsillar edema and erythema with small amount of exudate noted.   Eyes: Conjunctivae and EOM are normal.  Neck: Normal range of motion.  Cardiovascular: Normal rate and regular rhythm.  Exam reveals no gallop and no friction rub.   No murmur heard. Pulmonary/Chest: Effort normal and breath sounds normal. He has no wheezes. He has no rales. He exhibits no tenderness.  Abdominal: Soft.  Musculoskeletal: Normal range of motion.  Lymphadenopathy:    He has cervical adenopathy.  Neurological: He is alert. Coordination normal.  Speech is goal-oriented. Moves limbs without ataxia.   Skin: Skin is warm and dry.  Psychiatric: He has a normal mood and affect. His behavior is normal.    ED Course  Procedures (including critical care time) Labs Review Labs Reviewed - No data to display Imaging Review No results found.   EKG Interpretation None      MDM   Final diagnoses:  Tonsillitis    11:35 AM Patient likely has tonsillitis. I will treat him with PO amoxicillin and tramadol for pain. Vitals stable and patient afebrile. Patient advised to return with  worsening or concerning symptoms.    Emilia BeckKaitlyn Goran Olden, PA-C 08/06/13 [phone]

## 2013-08-06 NOTE — Discharge Instructions (Signed)
Take amoxicillin as directed until gone. Take tramadol as needed for pain. Refer to attached documents for more information. Return to the ED with worsening or concerning symptoms.

## 2013-08-17 ENCOUNTER — Emergency Department (HOSPITAL_COMMUNITY): Payer: Self-pay

## 2013-08-17 ENCOUNTER — Encounter (HOSPITAL_COMMUNITY): Payer: Self-pay | Admitting: Emergency Medicine

## 2013-08-17 ENCOUNTER — Emergency Department (HOSPITAL_COMMUNITY)
Admission: EM | Admit: 2013-08-17 | Discharge: 2013-08-17 | Disposition: A | Discharge status: Home / Self Care | Payer: Self-pay | Attending: Emergency Medicine | Admitting: Emergency Medicine

## 2013-08-17 DIAGNOSIS — Z87891 Personal history of nicotine dependence: Secondary | ICD-10-CM | POA: Insufficient documentation

## 2013-08-17 DIAGNOSIS — Z79899 Other long term (current) drug therapy: Secondary | ICD-10-CM | POA: Insufficient documentation

## 2013-08-17 DIAGNOSIS — J45909 Unspecified asthma, uncomplicated: Secondary | ICD-10-CM | POA: Insufficient documentation

## 2013-08-17 DIAGNOSIS — S298XXA Other specified injuries of thorax, initial encounter: Secondary | ICD-10-CM | POA: Insufficient documentation

## 2013-08-17 DIAGNOSIS — S46909A Unspecified injury of unspecified muscle, fascia and tendon at shoulder and upper arm level, unspecified arm, initial encounter: Secondary | ICD-10-CM | POA: Insufficient documentation

## 2013-08-17 DIAGNOSIS — M25519 Pain in unspecified shoulder: Secondary | ICD-10-CM

## 2013-08-17 DIAGNOSIS — S4980XA Other specified injuries of shoulder and upper arm, unspecified arm, initial encounter: Secondary | ICD-10-CM | POA: Insufficient documentation

## 2013-08-17 DIAGNOSIS — Y9389 Activity, other specified: Secondary | ICD-10-CM | POA: Insufficient documentation

## 2013-08-17 DIAGNOSIS — Y92410 Unspecified street and highway as the place of occurrence of the external cause: Secondary | ICD-10-CM | POA: Insufficient documentation

## 2013-08-17 MED ORDER — OXYCODONE-ACETAMINOPHEN 5-325 MG PO TABS
2.0000 | ORAL_TABLET | Freq: Once | ORAL | Status: AC
  Administered 2013-08-17: 2 via ORAL
  Filled 2013-08-17: qty 2

## 2013-08-17 MED ORDER — IBUPROFEN 800 MG PO TABS: 800.0000 mg | ORAL_TABLET | Freq: Three times a day (TID) | ORAL | Status: DC

## 2013-08-17 NOTE — Discharge Instructions (Signed)
Acromioclavicular Injuries  The AC (acromioclavicular) joint is the joint in the shoulder where the collarbone (clavicle) meets the shoulder blade (scapula). The part of the shoulder blade connected to the collarbone is called the acromion. Common problems with and treatments for the AC joint are detailed below.  ARTHRITIS  Arthritis occurs when the joint has been injured and the smooth padding between the joints (cartilage) is lost. This is the wear and tear seen in most joints of the body if they have been overused. This causes the joint to produce pain and swelling which is worse with activity.   AC JOINT SEPARATION  AC joint separation means that the ligaments connecting the acromion of the shoulder blade and collarbone have been damaged, and the two bones no longer line up. AC separations can be anywhere from mild to severe, and are "graded" depending upon which ligaments are torn and how badly they are torn.   Grade I Injury: the least damage is done, and the AC joint still lines up.   Grade II Injury: damage to the ligaments which reinforce the AC joint. In a Grade II injury, these ligaments are stretched but not entirely torn. When stressed, the AC joint becomes painful and unstable.   Grade III Injury: AC and secondary ligaments are completely torn, and the collarbone is no longer attached to the shoulder blade. This results in deformity; a prominence of the end of the clavicle.  AC JOINT FRACTURE  AC joint fracture means that there has been a break in the bones of the AC joint, usually the end of the clavicle.  TREATMENT  TREATMENT OF AC ARTHRITIS   There is currently no way to replace the cartilage damaged by arthritis. The best way to improve the condition is to decrease the activities which aggravate the problem. Application of ice to the joint helps decrease pain and soreness (inflammation). The use of non-steroidal anti-inflammatory medication is helpful.   If less conservative measures do not  work, then cortisone shots (injections) may be used. These are anti-inflammatories; they decrease the soreness in the joint and swelling.   If non-surgical measures fail, surgery may be recommended. The procedure is generally removal of a portion of the end of the clavicle. This is the part of the collarbone closest to your acromion which is stabilized with ligaments to the acromion of the shoulder blade. This surgery may be performed using a tube-like instrument with a light (arthroscope) for looking into a joint. It may also be performed as an open surgery through a small incision by the surgeon. Most patients will have good range of motion within 6 weeks and may return to all activity including sports by 8-12 weeks, barring complications.  TREATMENT OF AN AC SEPARATION   The initial treatment is to decrease pain. This is best accomplished by immobilizing the arm in a sling and placing an ice pack to the shoulder for 20 to 30 minutes every 2 hours as needed. As the pain starts to subside, it is important to begin moving the fingers, wrist, elbow and eventually the shoulder in order to prevent a stiff or "frozen" shoulder. Instruction on when and how much to move the shoulder will be provided by your caregiver. The length of time needed to regain full motion and function depends on the amount or grade of the injury. Recovery from a Grade I AC separation usually takes 10 to 14 days, whereas a Grade III may take 6 to 8 weeks.   Grade   I and II separations usually do not require surgery. Even Grade III injuries usually allow return to full activity with few restrictions. Treatment is also based on the activity demands of the injured shoulder. For example, a high level quarterback with an injured throwing arm will receive more aggressive treatment than someone with a desk job who rarely uses his/her arm for strenuous activities. In some cases, a painful lump may persist which could require a later surgery. Surgery  can be very successful, but the benefits must be weighed against the potential risks.  TREATMENT OF AN AC JOINT FRACTURE  Fracture treatment depends on the type of fracture. Sometimes a splint or sling may be all that is required. Other times surgery may be required for repair. This is more frequently the case when the ligaments supporting the clavicle are completely torn. Your caregiver will help you with these decisions and together you can decide what will be the best treatment.  HOME CARE INSTRUCTIONS    Apply ice to the injury for 15-20 minutes each hour while awake for 2 days. Put the ice in a plastic bag and place a towel between the bag of ice and skin.   If a sling has been applied, wear it constantly for as long as directed by your caregiver, even at night. The sling or splint can be removed for bathing or showering or as directed. Be sure to keep the shoulder in the same place as when the sling is on. Do not lift the arm.   If a figure-of-eight splint has been applied it should be tightened gently by another person every day. Tighten it enough to keep the shoulders held back. Allow enough room to place the index finger between the body and strap. Loosen the splint immediately if there is numbness or tingling in the hands.   Take over-the-counter or prescription medicines for pain, discomfort or fever as directed by your caregiver.   If you or your child has received a follow up appointment, it is very important to keep that appointment in order to avoid long term complications, chronic pain or disability.  SEEK MEDICAL CARE IF:    The pain is not relieved with medications.   There is increased swelling or discoloration that continues to get worse rather than better.   You or your child has been unable to follow up as instructed.   There is progressive numbness and tingling in the arm, forearm or hand.  SEEK IMMEDIATE MEDICAL CARE IF:    The arm is numb, cold or pale.   There is increasing pain  in the hand, forearm or fingers.  MAKE SURE YOU:    Understand these instructions.   Will watch your condition.   Will get help right away if you are not doing well or get worse.  Document Released: 02/16/2005 Document Revised: 08/01/2011 Document Reviewed: 08/11/2008  [employer] Patient Information 2014 [employer].
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
## 2013-08-17 NOTE — ED Provider Notes (Signed)
Medical screening examination/treatment/procedure(s) were performed by non-physician practitioner and as supervising physician I was immediately available for consultation/collaboration.   EKG Interpretation None       Gilda Creasehristopher J. Areli Jowett, MD 08/17/13 [phone]

## 2013-08-23 ENCOUNTER — Emergency Department (HOSPITAL_COMMUNITY)
Admission: EM | Admit: 2013-08-23 | Discharge: 2013-08-23 | Disposition: A | Discharge status: Home / Self Care | Payer: Self-pay | Attending: Emergency Medicine | Admitting: Emergency Medicine

## 2013-08-23 ENCOUNTER — Encounter (HOSPITAL_COMMUNITY): Payer: Self-pay | Admitting: Emergency Medicine

## 2013-08-23 DIAGNOSIS — Z87891 Personal history of nicotine dependence: Secondary | ICD-10-CM | POA: Insufficient documentation

## 2013-08-23 DIAGNOSIS — R0789 Other chest pain: Secondary | ICD-10-CM

## 2013-08-23 DIAGNOSIS — J45909 Unspecified asthma, uncomplicated: Secondary | ICD-10-CM | POA: Insufficient documentation

## 2013-08-23 DIAGNOSIS — Z79899 Other long term (current) drug therapy: Secondary | ICD-10-CM | POA: Insufficient documentation

## 2013-08-23 DIAGNOSIS — Z791 Long term (current) use of non-steroidal anti-inflammatories (NSAID): Secondary | ICD-10-CM | POA: Insufficient documentation

## 2013-08-23 DIAGNOSIS — Z202 Contact with and (suspected) exposure to infections with a predominantly sexual mode of transmission: Secondary | ICD-10-CM | POA: Insufficient documentation

## 2013-08-23 DIAGNOSIS — R071 Chest pain on breathing: Secondary | ICD-10-CM | POA: Insufficient documentation

## 2013-08-23 DIAGNOSIS — G8911 Acute pain due to trauma: Secondary | ICD-10-CM | POA: Insufficient documentation

## 2013-08-23 MED ORDER — CEFTRIAXONE SODIUM 250 MG IJ SOLR
250.0000 mg | Freq: Once | INTRAMUSCULAR | Status: AC
Start: 2013-08-23 — End: 2013-08-23
  Administered 2013-08-23: 250 mg via INTRAMUSCULAR
  Filled 2013-08-23: qty 250

## 2013-08-23 MED ORDER — AZITHROMYCIN 250 MG PO TABS
1000.0000 mg | ORAL_TABLET | Freq: Once | ORAL | Status: AC
  Administered 2013-08-23: 1000 mg via ORAL
  Filled 2013-08-23: qty 4
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
## 2013-08-23 NOTE — Discharge Instructions (Signed)
Sexually Transmitted Disease A sexually transmitted disease (STD) is a disease or infection that may be passed (transmitted) from person to person, usually during sexual activity. This may happen by way of saliva, semen, blood, vaginal mucus, or urine. Common STDs include:   Gonorrhea.   Chlamydia.   Syphilis.   HIV and AIDS.   Genital herpes.   Hepatitis B and C.   Trichomonas.   Human papillomavirus (HPV).   Pubic lice.   Scabies.  Mites.  Bacterial vaginosis. WHAT ARE CAUSES OF STDs? An STD may be caused by bacteria, a virus, or parasites. STDs are often transmitted during sexual activity if one person is infected. However, they may also be transmitted through nonsexual means. STDs may be transmitted after:   Sexual intercourse with an infected person.   Sharing sex toys with an infected person.   Sharing needles with an infected person or using unclean piercing or tattoo needles.  Having intimate contact with the genitals, mouth, or rectal areas of an infected person.   Exposure to infected fluids during birth. WHAT ARE THE SIGNS AND SYMPTOMS OF STDs? Different STDs have different symptoms. Some people may not have any symptoms. If symptoms are present, they may include:   Painful or bloody urination.   Pain in the pelvis, abdomen, vagina, anus, throat, or eyes.   Skin rash, itching, irritation, growths, sores (lesions), ulcerations, or warts in the genital or anal area.  Abnormal vaginal discharge with or without bad odor.   Penile discharge in men.   Fever.   Pain or bleeding during sexual intercourse.   Swollen glands in the groin area.   Yellow skin and eyes (jaundice). This is seen with hepatitis.   Swollen testicles.  Infertility.  Sores and blisters in the mouth. HOW ARE STDs DIAGNOSED? To make a diagnosis, your health care provider may:   Take a medical history.   Perform a physical exam.   Take a sample of any  discharge for examination.  Swab the throat, cervix, opening to the penis, rectum, or vagina for testing.  Test a sample of your first morning urine.   Perform blood tests.   Perform a Pap smear, if this applies.   Perform a colposcopy.   Perform a laparoscopy.  HOW ARE STDs TREATED? Treatment depends on the STD. Some STDs may be treated but not cured.   Chlamydia, gonorrhea, trichomonas, and syphilis can be cured with antibiotics.   Genital herpes, hepatitis, and HIV can be treated, but not cured, with prescribed medicines. The medicines lessen symptoms.   Genital warts from HPV can be treated with medicine or by freezing, burning (electrocautery), or surgery. Warts may come back.   HPV cannot be cured with medicine or surgery. However, abnormal areas may be removed from the cervix, vagina, or vulva.   If your diagnosis is confirmed, your recent sexual partners need treatment. This is true even if they are symptom-free or have a negative culture or evaluation. They should not have sex until their health care providers say it is OK. HOW CAN I REDUCE MY RISK OF GETTING AN STD?  Use latex condoms, dental dams, and water-soluble lubricants during sexual activity. Do not use petroleum jelly or oils.  Get vaccinated for HPV and hepatitis. If you have not received these vaccines in the past, talk to your health care provider about whether one or both might be right for you.   Avoid risky sex practices that can break the skin.  WHAT SHOULD  I DO IF I THINK I HAVE AN STD?  See your health care provider.   Inform all sexual partners. They should be tested and treated for any STDs.  Do not have sex until your health care provider says it is OK. WHEN SHOULD I GET HELP? Seek immediate medical care if:  You develop severe abdominal pain.  You are a man and notice swelling or pain in the testicles.  You are a woman and notice swelling or pain in your vagina. Document  Released: 07/30/2002 Document Revised: 02/27/2013 Document Reviewed: 11/27/2012 [facility] Patient Information 2014 [place], [place]. Safe Sex Safe sex is about reducing the risk of giving or getting a sexually transmitted disease (STD). STDs are spread through sexual contact involving the genitals, mouth, or rectum. Some STDS can be cured and others cannot. Safe sex can also prevent unintended pregnancies.  SAFE SEX PRACTICES  Limit your sexual activity to only one partner who is only having sex with you.  Talk to your partner about their past partners, past STDs, and drug use.  Use a condom every time you have sexual intercourse. This includes vaginal, oral, and anal sexual activity. Both females and males should wear condoms during oral sex. Only use latex or polyurethane condoms and water-based lubricants. Petroleum-based lubricants or oils used to lubricate a condom will weaken the condom and increase the chance that it will break. The condom should be in place from the beginning to the end of sexual activity. Wearing a condom reduces, but does not completely eliminate, your risk of getting or giving a STD. STDs can be spread by contact with skin of surrounding areas.  Get vaccinated for hepatitis B and HPV.  Avoid alcohol and recreational drugs which can affect your judgement. You may forget to use a condom or participate in high-risk sex.  For females, avoid douching after sexual intercourse. Douching can spread an infection farther into the reproductive tract.  Check your body for signs of sores, blisters, rashes, or unusual discharge. See your caregiver if you notice any of these signs.  Avoid sexual contact if you have symptoms of an infection or are being treated for an STD. If you or your partner has herpes, avoid sexual contact when blisters are present. Use condoms at all other times.  See your caregiver for regular screenings, examinations, and tests for STDs. Before having sex with  a new partner, each of you should be screened for STDs and talk about the results with your partner. BENEFITS OF SAFE SEX   There is less of a chance of getting or giving an STD.  You can prevent unwanted or unintended pregnancies.  By discussing safer sex concerns with your partner, you may increase feelings of intimacy, comfort, trust, and honesty between the both of you. Document Released: 06/16/2004 Document Revised: 02/01/2012 Document Reviewed: 10/31/2011 [facility] Patient Information 2014 [place], [place]. Chest Wall Pain Chest wall pain is pain in or around the bones and muscles of your chest. It may take up to 6 weeks to get better. It may take longer if you must stay physically active in your work and activities.  CAUSES  Chest wall pain may happen on its own. However, it may be caused by:  A viral illness like the flu.  Injury.  Coughing.  Exercise.  Arthritis.  Fibromyalgia.  Shingles. HOME CARE INSTRUCTIONS   Avoid overtiring physical activity. Try not to strain or perform activities that cause pain. This includes any activities using your chest or your  abdominal and side muscles, especially if heavy weights are used.  Put ice on the sore area.  Put ice in a plastic bag.  Place a towel between your skin and the bag.  Leave the ice on for 15-20 minutes per hour while awake for the first 2 days.  Only take over-the-counter or prescription medicines for pain, discomfort, or fever as directed by your caregiver. SEEK IMMEDIATE MEDICAL CARE IF:   Your pain increases, or you are very uncomfortable.  You have a fever.  Your chest pain becomes worse.  You have new, unexplained symptoms.  You have nausea or vomiting.  You feel sweaty or lightheaded.  You have a cough with phlegm (sputum), or you cough up blood. MAKE SURE YOU:   Understand these instructions.  Will watch your condition.  Will get help right away if you are not doing well or get  worse. Document Released: 05/09/2005 Document Revised: 08/01/2011 Document Reviewed: 01/03/2011 [facility] Patient Information 2014 [place], [place].

## 2013-08-23 NOTE — ED Provider Notes (Signed)
Medical screening examination/treatment/procedure(s) were performed by non-physician practitioner and as supervising physician I was immediately available for consultation/collaboration.   EKG Interpretation None       Alston Berrie F Xavien Dauphinais, MD 08/23/13 1915 

## 2013-11-12 ENCOUNTER — Emergency Department (INDEPENDENT_AMBULATORY_CARE_PROVIDER_SITE_OTHER)
Admission: EM | Admit: 2013-11-12 | Discharge: 2013-11-12 | Disposition: A | Discharge status: Home / Self Care | Payer: Self-pay | Source: Home / Self Care

## 2013-11-12 ENCOUNTER — Other Ambulatory Visit (HOSPITAL_COMMUNITY)
Admission: RE | Admit: 2013-11-12 | Discharge: 2013-11-12 | Disposition: A | Discharge status: Home / Self Care | Payer: Self-pay | Source: Ambulatory Visit | Attending: Emergency Medicine | Admitting: Emergency Medicine

## 2013-11-12 ENCOUNTER — Encounter (HOSPITAL_COMMUNITY): Payer: Self-pay | Admitting: Emergency Medicine

## 2013-11-12 DIAGNOSIS — Z202 Contact with and (suspected) exposure to infections with a predominantly sexual mode of transmission: Secondary | ICD-10-CM

## 2013-11-12 DIAGNOSIS — Z113 Encounter for screening for infections with a predominantly sexual mode of transmission: Secondary | ICD-10-CM | POA: Insufficient documentation

## 2013-11-12 MED ORDER — CEFTRIAXONE SODIUM 250 MG IJ SOLR
250.0000 mg | Freq: Once | INTRAMUSCULAR | Status: AC
  Administered 2013-11-12: 250 mg via INTRAMUSCULAR

## 2013-11-12 MED ORDER — AZITHROMYCIN 250 MG PO TABS: ORAL_TABLET | ORAL | Status: DC

## 2013-11-12 MED ORDER — CEFTRIAXONE SODIUM 250 MG IJ SOLR
INTRAMUSCULAR | Status: AC
  Filled 2013-11-12: qty 250

## 2013-11-12 MED ORDER — LIDOCAINE HCL (PF) 1 % IJ SOLN
INTRAMUSCULAR | Status: AC
  Filled 2013-11-12: qty 5

## 2013-11-12 NOTE — ED Notes (Signed)
C/o exposure to std States he received a phone call yesterday by his partner stating she does have gonorrhea

## 2013-11-12 NOTE — ED Provider Notes (Signed)
CSN: [id number]     Arrival date & time 11/12/13  1116 History   None    Chief Complaint  Patient presents with  . Exposure to STD   (Consider location/radiation/quality/duration/timing/severity/associated sxs/prior Treatment) HPI Comments: 28 year old male states "the girl I been messing with told me she had gonorrhea". Patient denies symptoms. He is here to get checked for STDs.   Past Medical History  Diagnosis Date  . Asthma    Past Surgical History  Procedure Laterality Date  . Eye surgery     Family History  Problem Relation Age of Onset  . Diabetes Father    History  Substance Use Topics  . Smoking status: Former Smoker    Quit date: 09/17/2008  . Smokeless tobacco: Not on file  . Alcohol Use: Yes     Comment: occasionally    Review of Systems  Genitourinary: Negative.   All other systems reviewed and are negative.   Allergies  Review of patient's allergies indicates no known allergies.  Home Medications   Prior to Admission medications   Medication Sig Start Date End Date Taking? Authorizing Provider  albuterol (PROVENTIL HFA;VENTOLIN HFA) 108 (90 BASE) MCG/ACT inhaler Inhale 1-2 puffs into the lungs every 6 (six) hours as needed for wheezing or shortness of breath.     Historical Provider, MD  azithromycin (ZITHROMAX) 250 MG tablet Take all 4 tabs po stat 11/12/13   Hayden Rasmussenavid Mabe, NP  ibuprofen (ADVIL,MOTRIN) 800 MG tablet Take 1 tablet (800 mg total) by mouth 3 (three) times daily. 08/17/13   Roxy Horsemanobert Browning, PA-C   BP 143/100  Pulse 68  Temp(Src) 98.5 F (36.9 C) (Oral)  Resp 12  SpO2 96% Physical Exam  Nursing note and vitals reviewed. Constitutional: He is oriented to person, place, and time. He appears well-developed and well-nourished.  Pulmonary/Chest: Effort normal. No respiratory distress.  Genitourinary: Penis normal.  Neurological: He is alert and oriented to person, place, and time.  Skin: Skin is warm and dry. No rash noted. No erythema.   Psychiatric: He has a normal mood and affect.    ED Course  Procedures (including critical care time) Labs Review Labs Reviewed  URINE CYTOLOGY ANCILLARY ONLY    Imaging Review No results found.   MDM   1. Exposure to STD     Rocephin 250 mg iIM Azithromycin 1 gm PO per rx Urine cytology pending.    Hayden Rasmussenavid Mabe, NP 11/12/13 1230

## 2013-11-12 NOTE — Discharge Instructions (Signed)
Gonorrhea Gonorrhea is an infection that can cause serious problems. If left untreated, may   Damage the male or male organs.   Cause women to be unable to have children (sterility).   Harm a fetus, if the infected woman is pregnant.  It is important to get treatment for gonorrhea as soon as possible. It is also necessary that all your sexual partners be tested for the infection.  CAUSES  Gonorrhea is caused by bacteria called Neisseria gonorrhoeae. The infection is spread from person to person, usually by sexual contact (such as by anal, vaginal, or oral means). A newborn can contract the infection from his or her mother during birth.  SYMPTOMS  Some people with gonorrhea do not have symptoms. Symptoms may be different in females and males.  Females The most common symptoms are:   Pain in the lower abdomen.   Fever with or without chills.  Other symptoms include:   Abnormal vaginal discharge.   Painful intercourse.   Burning or itching of the vagina or lips of the vagina.   Abnormal vaginal bleeding.   Pain when urinating.   Long-lasting (chronic) pain in the lower abdomen, especially during menstruation or intercourse.   Inability to become pregnant.   Going into premature labor.   Irritation, pain, bleeding, or discharge from the rectum. This may occur if the infection was spread by anal sex.   Sore throat or swollen neck lymph nodes. This may occur if the infection was spread by oral sex.  Males The most common symptoms are:   Discharge from the penis.   Pain or burning during urination.   Pain or swelling in the testicles. Other symptoms may include:   Irritation, pain, bleeding, or discharge from the rectum. This may occur if the infection was spread by anal sex.   Sore throat, fever, or swollen neck lymph nodes. This may occur if the infection was spread by oral sex.  DIAGNOSIS  A diagnosis is made after a physical exam is done and a  sample of discharge is examined under a microscope for the presence of the bacteria. The discharge may be taken from the urethra, cervix, throat, or rectum.  TREATMENT  Gonorrhea is treated with antibiotic medicines. It is important for treatment to begin as soon as possible. Early treatment may prevent some problems from developing.  HOME CARE INSTRUCTIONS   Only take over-the-counter or prescription medicines for pain, fever, or discomfort as directed by your health care provider.   Take antibiotics as directed. Make sure you finish them even if you start to feel better. Incomplete treatment will put you at risk for continued infection.   Do not have sex until treatment is complete or as directed by your health care provider.   Follow up with your health care provider as directed.   Not all test results are available during your visit. If your test results are not back during the visit, make an appointment with your health care provider to find out the results. Do not assume everything is normal if you have not heard from your health care provider or the medical facility. It is important for you to follow up on all of your test results.   If you test positive for gonorrhea, inform your recent sexual partners. They need to be checked for gonorrhea even if they do not have symptoms. They may need treatment, even if they test negative for gonorrhea.  SEEK MEDICAL CARE IF:   You develop any bad  reaction to the medicine you were prescribed. This may include:   A rash.   Nausea.   Vomiting.   Diarrhea.   Your symptoms do not improve after a few days of taking antibiotics.   Your symptoms get worse.   You develop increased pain, such as in the testicles (for males) or in the abdomen (for females).  SEEK IMMEDIATE MEDICAL CARE IF:  You have a fever or persistent symptoms for more than 2-3 days.   You have a fever and your symptoms suddenly get worse.  MAKE SURE YOU:     Understand these instructions.  Will watch your condition.  Will get help right away if you are not doing well or get worse. Document Released: 05/06/2000 Document Revised: 02/27/2013 Document Reviewed: 11/14/2012 [facility] Patient Information 2015 [place], [place]. This information is not intended to replace advice given to you by your health care provider. Make sure you discuss any questions you have with your health care provider.  Sexually Transmitted Disease A sexually transmitted disease (STD) is a disease or infection often passed to another person during sex. However, STDs can be passed through nonsexual ways. An STD can be passed through:  Spit (saliva).  Semen.  Blood.  Mucus from the vagina.  Pee (urine). HOW CAN I LESSEN MY CHANCES OF GETTING AN STD?  Use:  Latex condoms.  Water-soluble lubricants with condoms. Do not use petroleum jelly or oils.  Dental dams. These are small pieces of latex that are used as a barrier during oral sex.  Avoid having more than one sex partner.  Do not have sex with someone who has other sex partners.  Do not have sex with anyone you do not know or who is at high risk for an STD.  Avoid risky sex that can break your skin.  Do not have sex if you have open sores on your mouth or skin.  Avoid drinking too much alcohol or taking illegal drugs. Alcohol and drugs can affect your good judgment.  Avoid oral and anal sex acts.  Get shots (vaccines) for HPV and hepatitis.  If you are at risk of being infected with HIV, it is advised that you take a certain medicine daily to prevent HIV infection. This is called pre-exposure prophylaxis (PrEP). You may be at risk if:  You are a man who has sex with other men (MSM).  You are attracted to the opposite sex (heterosexual) and are having sex with more than one partner.  You take drugs with a needle.  You have sex with someone who has HIV.  Talk with your doctor about if you are at  high risk of being infected with HIV. If you begin to take PrEP, get tested for HIV first. Get tested every 3 months for as long as you are taking PrEP. WHAT SHOULD I DO IF I THINK I HAVE AN STD?  See your doctor.  Tell your sex partner(s) that you have an STD. They should be tested and treated.  Do not have sex until your doctor says it is okay. WHEN SHOULD I GET HELP? Get help right away if:  You have bad belly (abdominal) pain.  You are a man and have puffiness (swelling) or pain in your testicles.  You are a woman and have puffiness in your vagina. Document Released: 06/16/2004 Document Revised: 05/14/2013 Document Reviewed: 11/02/2012 [facility] Patient Information 2015 [place], [place]. This information is not intended to replace advice given to you by your health care  provider. Make sure you discuss any questions you have with your health care provider. ° °

## 2013-11-12 NOTE — ED Provider Notes (Signed)
Medical screening examination/treatment/procedure(s) were performed by resident physician or non-physician practitioner and as supervising physician I was immediately available for consultation/collaboration.   KINDL,JAMES DOUGLAS MD.   James D Kindl, MD 11/12/13 1513 

## 2014-03-26 ENCOUNTER — Emergency Department (HOSPITAL_COMMUNITY): Payer: No Typology Code available for payment source

## 2014-03-26 ENCOUNTER — Encounter (HOSPITAL_COMMUNITY): Payer: Self-pay | Admitting: *Deleted

## 2014-03-26 ENCOUNTER — Emergency Department (HOSPITAL_COMMUNITY)
Admission: EM | Admit: 2014-03-26 | Discharge: 2014-03-26 | Disposition: A | Discharge status: Home / Self Care | Payer: No Typology Code available for payment source | Attending: Emergency Medicine | Admitting: Emergency Medicine

## 2014-03-26 DIAGNOSIS — Z791 Long term (current) use of non-steroidal anti-inflammatories (NSAID): Secondary | ICD-10-CM | POA: Diagnosis not present

## 2014-03-26 DIAGNOSIS — Y9389 Activity, other specified: Secondary | ICD-10-CM | POA: Diagnosis not present

## 2014-03-26 DIAGNOSIS — Z79899 Other long term (current) drug therapy: Secondary | ICD-10-CM | POA: Diagnosis not present

## 2014-03-26 DIAGNOSIS — R52 Pain, unspecified: Secondary | ICD-10-CM

## 2014-03-26 DIAGNOSIS — J45901 Unspecified asthma with (acute) exacerbation: Secondary | ICD-10-CM | POA: Insufficient documentation

## 2014-03-26 DIAGNOSIS — Y92410 Unspecified street and highway as the place of occurrence of the external cause: Secondary | ICD-10-CM | POA: Insufficient documentation

## 2014-03-26 DIAGNOSIS — S199XXA Unspecified injury of neck, initial encounter: Secondary | ICD-10-CM | POA: Diagnosis not present

## 2014-03-26 DIAGNOSIS — S4991XA Unspecified injury of right shoulder and upper arm, initial encounter: Secondary | ICD-10-CM | POA: Insufficient documentation

## 2014-03-26 MED ORDER — HYDROCODONE-ACETAMINOPHEN 5-325 MG PO TABS
1.0000 | ORAL_TABLET | Freq: Once | ORAL | Status: AC
  Administered 2014-03-26: 1 via ORAL
  Filled 2014-03-26: qty 1

## 2014-03-26 MED ORDER — IPRATROPIUM-ALBUTEROL 0.5-2.5 (3) MG/3ML IN SOLN
3.0000 mL | RESPIRATORY_TRACT | Status: DC
  Administered 2014-03-26: 3 mL via RESPIRATORY_TRACT
  Filled 2014-03-26: qty 3

## 2014-03-26 MED ORDER — PREDNISONE 20 MG PO TABS: 40.0000 mg | ORAL_TABLET | Freq: Every day | ORAL | Status: DC

## 2014-03-26 MED ORDER — PREDNISONE 20 MG PO TABS
40.0000 mg | ORAL_TABLET | Freq: Once | ORAL | Status: AC
  Administered 2014-03-26: 40 mg via ORAL
  Filled 2014-03-26: qty 2

## 2014-03-26 MED ORDER — ALBUTEROL SULFATE (2.5 MG/3ML) 0.083% IN NEBU
5.0000 mg | INHALATION_SOLUTION | Freq: Once | RESPIRATORY_TRACT | Status: AC
  Administered 2014-03-26: 5 mg via RESPIRATORY_TRACT
  Filled 2014-03-26: qty 6

## 2014-03-26 MED ORDER — ALBUTEROL SULFATE HFA 108 (90 BASE) MCG/ACT IN AERS: 2.0000 | INHALATION_SPRAY | RESPIRATORY_TRACT | Status: DC | PRN

## 2014-03-26 NOTE — Discharge Instructions (Signed)
Please follow the directions provided. Be sure to use the resources to establish care with a primary care provider. Please take the prednisone daily for the next 5 days to help with inflammation and you were wheezing. He may use the albuterol 2 puffs every 4 hours for cough or shortness of breath. If you need your inhaler more often than not please be seen in the emergency room or by your doctor for further evaluation. Don't hesitate to return for new, worsening, or concerning symptoms.  SEEK MEDICAL CARE IF:  Your pain, swelling, or redness at the joint increases.  SEEK IMMEDIATE MEDICAL CARE IF:  You have a fever.  You cannot move your arm or shoulder.    Emergency Department Resource Guide 1) Find a Doctor and Pay Out of Pocket Although you won't have to find out who is covered by your insurance plan, it is a good idea to ask around and get recommendations. You will then need to call the office and see if the doctor you have chosen will accept you as a new patient and what types of options they offer for patients who are self-pay. Some doctors offer discounts or will set up payment plans for their patients who do not have insurance, but you will need to ask so you aren't surprised when you get to your appointment.  2) Contact Your Local Health Department Not all health departments have doctors that can see patients for sick visits, but many do, so it is worth a call to see if yours does. If you don't know where your local health department is, you can check in your phone book. The CDC also has a tool to help you locate your state's health department, and many state websites also have listings of all of their local health departments.  3) Find a Walk-in Clinic If your illness is not likely to be very severe or complicated, you may want to try a walk in clinic. These are popping up all over the country in pharmacies, drugstores, and shopping centers. They're usually staffed by nurse practitioners  or physician assistants that have been trained to treat common illnesses and complaints. They're usually fairly quick and inexpensive. However, if you have serious medical issues or chronic medical problems, these are probably not your best option.  No Primary Care Doctor: - Call Health Connect at  [phone] - they can help you locate a primary care doctor that  accepts your insurance, provides certain services, etc. - Physician Referral Service- [phone]  Chronic Pain Problems: Organization         Address  Phone   Notes  [facility]  [phone] Patients need to be referred by their primary care doctor.   Medication Assistance: Organization         Address  Phone   Notes  [facility] Medication [facility] [address] [place] [place], [place] [address] [phone] --Must be a resident of [facility] -- Must have NO insurance coverage whatsoever (no Medicaid/ Medicare, etc.) -- The pt. MUST have a primary care doctor that directs their care regularly and follows them in the community   [facility]ist  [phone]   [employer]  [phone]    Agencies that provide inexpensive medical care: Organization         Address  Phone   Notes  [facility]  [phone]   [facility]    [phone]  [facility] [address] [place], [place] [address] [phone]   [facility] [address] [place], [place] [phone]   Planned Parenthood    [phone]   [facility]    [phone]   [facility]  [address], [place] Phone:  [phone], Fax:  [phone] Hours of Operation:  9 am - 6 pm, M-F.  Also accepts Medicaid/Medicare and self-pay.  [facility]  [address], [place] Phone: [phone], Fax: [phone]. Hours of Operation:  8:30 am - 5:30 pm, M-F.   Also accepts Medicaid and self-pay.  [facility] [place] [address], [place] Phone: [phone]   [facility] [address] Natasha Bence [place], [place] [phone], Ext. [phone] Mondays & Thursdays: 7-9 AM.  First 15 patients are seen on a first come, first serve basis.    Medicaid-accepting [facility] Providers:  Organization         Address  Phone   Notes  [facility] [address], [place] [phone] Also accepts self-pay patients.  [facility] [address] Laurell Josephs [place], [place]  [phone]   [facility] [address], [place] [phone]   [facility] [address] [place] [phone]   Renaye Rakers [address], [place]   [phone] Only accepts [place] patients after they have their name applied to their card.   Self-Pay (no insurance) in [facility]:  Organization         Address  Phone   Notes  Sickle Cell Patients, [facility] [address] [place], [place] [phone]   [facility] [address] [place], [place] [phone]   [facility]  [address], [place] [phone]   [facility]/Dr. Osei-Bonsu  [address], [place] or [address], [place] [phone] Phone number for both [place] and [place] locations is the same.  [facility] [address] [place] [phone]   [facility] [address], [place] or [address] [phone]   [facility] [address], [place] [phone], phone; [phone], fax Sees patients 1st and 3rd Saturday of every month.  Must not qualify for public or private insurance (i.e. Medicaid, Medicare, [place] Health Choice, Veterans'  Benefits)  Household income should be no more than 200% of the poverty level The clinic cannot treat you if you are pregnant or think you are pregnant  Sexually transmitted diseases are not treated at the clinic.    Dental Care: Organization         Address  Phone  Notes  [facility] Department of [facility] [address] [place], [place] [phone] Accepts children up to age 89 who are enrolled in [place] or [place] Health Choice; pregnant women with a Medicaid card; and children who have applied for Medicaid or [place] Health Choice, but were declined, whose parents can pay a reduced fee at time of service.  [facility] Department of [facility]  [address], [place] [phone] Accepts children up to age 26 who  are enrolled in Medicaid or [place] Health Choice; pregnant women with a Medicaid card; and children who have applied for Medicaid or [place] Health Choice, but were declined, whose parents can pay a reduced fee at time of service.  [facility]  [address], [place] [phone] Patients are seen by appointment only. Walk-ins are not accepted. [facility] will see patients 30 years of age and older. Monday - Tuesday (8am-5pm) Most Wednesdays (8:30-5pm) $30 per visit, cash only  [facility]  [address], [facility] [phone] Patients are seen by appointment only. Walk-ins are not accepted. [facility] will see patients 30 years of age and older. One Wednesday Evening (Monthly: Volunteer Based).  $30 per visit, cash only  [facility] [facility]  [phone] for adults; Children under age 594, call Graduate Pediatric Dentistry at [phone]. Children aged 634-14, please call [phone] to request a pediatric application.  Dental services are provided in all areas of dental care including fillings, crowns and bridges, complete and partial  dentures, implants, gum treatment, root canals, and extractions. Preventive care is also provided. Treatment is provided to both adults and children. Patients are selected via a lottery and there is often a waiting list.   [facility] [address], [place]  [phone]) [phone] [email or web address]   [facility] [address], [place], [place] [phone], Ext. [phone] Second and Fourth Thursday of each month, opens at 6:30 AM; Clinic ends at 9 AM.  Patients are seen on a first-come first-served basis, and a limited number are seen during each clinic.   [facility]  [address], [place], [place] [phone]) [phone]   Eligibility Requirements You must have lived in [place], [place], or [place] counties for at least the last three months.   You cannot be eligible for state or federal sponsored National Cityhealthcare insurance, including CIGNAVeterans Administration, [place]icaid, or [employer]icare.   You generally cannot be eligible for healthcare insurance through your employer.    How to apply: Eligibility screenings are held every Tuesday and Wednesday afternoon from 1:00 pm until 4:00 pm. You do not need an appointment for the interview!  [facility] [address], [place], [place] [phone]   [facility]  [phone]   [facility]  [phone]   [facility]  [phone]    Behavioral Health Resources in the Community: Intensive Outpatient Programs Organization         Address  Phone  Notes  [facility] [address], [place], [place] [phone]   [facility] [address], [place], [place] [phone]   [facility] [address], [place], [place]  [phone]   [facility] [address],  [place], [place] [phone] or [phone]   Substance Abuse Resources Organization          Address  Phone  Notes  [facility]  [phone]   [facility]  [phone]   [facility]  [phone]   [facility]  [phone]   Residential & Outpatient Substance Abuse Program  [phone]   Psychological Services Organization         Address  Phone  Notes  [facility]  [phone]- [phone]   [facility]  [phone]- [phone]   [facility] [address],  [place] [phone] or [phone]    Mobile Crisis Teams Organization         Address  Phone  Notes  [facility], Mobile Crisis Care Unit  [phone]   [facility]  [address] [place], [place] [phone]   Doristine LocksSharon DeEsch [address] [place] [phone]    Self-Help/Support Groups Organization         Address  Phone             Notes  [facility] [place] - variety of support groups  336- [phone] Call for more information  Narcotics Anonymous (NA), [facility] [address], [place]  2 meetings at this location   Statisticianesidential Treatment Programs Organization         Address  Phone  Notes  [facility] [address],    [place]  [phone]   [facility]  [address], [place] [phone], [place], [place] [phone]   [facility] [address], [place] [phone] Admissions: 8am-3pm M-F  [facility] [address]    [place], [place] [phone]   [facility] [address], [place], [place] [phone]   [facility] [address]  [place], [place] [phone]   [facility] - Intensive Outpatient [address], [place], [place] [phone]   [facility] [address]  [place], [place] [phone] or [phone]   [facility] ([facility]) [address] [place], [place]  [phone] Accepts Medicaid  [facility] [address]  [place] [phone] Substance Abuse/Addiction Treatment   [facility] Resources Organization         Address  Phone  Notes  [facility]  [phone]   Angie FavaJulie Brannon, PhD [address] [place], [address] [place], [place]   [phone] or [phone](336) [phone]   [facility]   [address] [place], [place] [phone]) [phone]   [facility] [address], [place], [place] [phone](336) [phone] Insurance/Medicaid/sponsorship through [facility]  [facility] [address]                                    [place], [place] [phone](336) [phone] Therapy/tele-psych/case  [facility] [address].   [place], [place] [phone]) [phone]    Dr. Lolly MustacheArfeen  [phone]   [facility]  [employer] [facility] 1) [address], [place] 2) [address], [place] 3)  [address], [place] [phone](336) [phone]) [phone]  [phone](336) [phone]   [facility] Child Abuse Hotline [phone]) [phone] or [phone] (After Hours)
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
## 2015-02-01 ENCOUNTER — Encounter (HOSPITAL_COMMUNITY): Payer: Self-pay | Admitting: *Deleted

## 2015-02-01 ENCOUNTER — Emergency Department (HOSPITAL_COMMUNITY): Payer: 59

## 2015-02-01 ENCOUNTER — Inpatient Hospital Stay (HOSPITAL_COMMUNITY)
Admission: EM | Admit: 2015-02-01 | Discharge: 2015-02-03 | DRG: 202 | Disposition: A | Discharge status: Home / Self Care | Payer: 59 | Attending: Internal Medicine | Admitting: Internal Medicine

## 2015-02-01 DIAGNOSIS — J45901 Unspecified asthma with (acute) exacerbation: Principal | ICD-10-CM | POA: Diagnosis present

## 2015-02-01 DIAGNOSIS — F1910 Other psychoactive substance abuse, uncomplicated: Secondary | ICD-10-CM | POA: Diagnosis not present

## 2015-02-01 DIAGNOSIS — N189 Chronic kidney disease, unspecified: Secondary | ICD-10-CM | POA: Diagnosis present

## 2015-02-01 DIAGNOSIS — E876 Hypokalemia: Secondary | ICD-10-CM | POA: Diagnosis not present

## 2015-02-01 DIAGNOSIS — J069 Acute upper respiratory infection, unspecified: Secondary | ICD-10-CM | POA: Diagnosis present

## 2015-02-01 DIAGNOSIS — Z6820 Body mass index (BMI) 20.0-20.9, adult: Secondary | ICD-10-CM

## 2015-02-01 DIAGNOSIS — E43 Unspecified severe protein-calorie malnutrition: Secondary | ICD-10-CM | POA: Diagnosis present

## 2015-02-01 DIAGNOSIS — N179 Acute kidney failure, unspecified: Secondary | ICD-10-CM | POA: Insufficient documentation

## 2015-02-01 DIAGNOSIS — F17210 Nicotine dependence, cigarettes, uncomplicated: Secondary | ICD-10-CM | POA: Diagnosis present

## 2015-02-01 LAB — CBC WITH DIFFERENTIAL/PLATELET
BASOS ABS: 0 10*3/uL (ref 0.0–0.1)
BASOS PCT: 0 % (ref 0–1)
Eosinophils Absolute: 0.1 10*3/uL (ref 0.0–0.7)
Eosinophils Relative: 1 % (ref 0–5)
HEMATOCRIT: 48.2 % (ref 39.0–52.0)
Hemoglobin: 16.3 g/dL (ref 13.0–17.0)
Lymphocytes Relative: 6 % — ABNORMAL LOW (ref 12–46)
Lymphs Abs: 0.6 10*3/uL — ABNORMAL LOW (ref 0.7–4.0)
MCH: 29.5 pg (ref 26.0–34.0)
MCHC: 33.8 g/dL (ref 30.0–36.0)
MCV: 87.2 fL (ref 78.0–100.0)
Monocytes Absolute: 0.2 10*3/uL (ref 0.1–1.0)
Monocytes Relative: 2 % — ABNORMAL LOW (ref 3–12)
NEUTROS ABS: 9.5 10*3/uL — AB (ref 1.7–7.7)
Neutrophils Relative %: 91 % — ABNORMAL HIGH (ref 43–77)
Platelets: 161 10*3/uL (ref 150–400)
RBC: 5.53 MIL/uL (ref 4.22–5.81)
RDW: 13.7 % (ref 11.5–15.5)
WBC: 10.4 10*3/uL (ref 4.0–10.5)

## 2015-02-01 LAB — BASIC METABOLIC PANEL
ANION GAP: 10 (ref 5–15)
BUN: 13 mg/dL (ref 6–20)
CO2: 27 mmol/L (ref 22–32)
Calcium: 9.5 mg/dL (ref 8.9–10.3)
Chloride: 102 mmol/L (ref 101–111)
Creatinine, Ser: 1.31 mg/dL — ABNORMAL HIGH (ref 0.61–1.24)
GFR calc non Af Amer: >60 mL/min (ref >60)
GLUCOSE: 110 mg/dL — AB (ref 65–99)
Potassium: 3.1 mmol/L — ABNORMAL LOW (ref 3.5–5.1)
Sodium: 139 mmol/L (ref 135–145)

## 2015-02-01 MED ORDER — ONDANSETRON HCL 4 MG/2ML IJ SOLN: 4.0000 mg | Freq: Four times a day (QID) | INTRAMUSCULAR | Status: DC | PRN

## 2015-02-01 MED ORDER — MAGNESIUM SULFATE 2 GM/50ML IV SOLN
2.0000 g | Freq: Once | INTRAVENOUS | Status: AC
  Administered 2015-02-02: 2 g via INTRAVENOUS
  Filled 2015-02-01: qty 50

## 2015-02-01 MED ORDER — GUAIFENESIN ER 600 MG PO TB12
600.0000 mg | ORAL_TABLET | Freq: Two times a day (BID) | ORAL | Status: DC
  Administered 2015-02-02 (×3): 600 mg via ORAL
  Filled 2015-02-01 (×3): qty 1

## 2015-02-01 MED ORDER — ALBUTEROL (5 MG/ML) CONTINUOUS INHALATION SOLN
10.0000 mg/h | INHALATION_SOLUTION | RESPIRATORY_TRACT | Status: DC
  Administered 2015-02-01: 10 mg/h via RESPIRATORY_TRACT
  Filled 2015-02-01: qty 20

## 2015-02-01 MED ORDER — ONDANSETRON HCL 4 MG PO TABS
4.0000 mg | ORAL_TABLET | Freq: Four times a day (QID) | ORAL | Status: DC | PRN
Start: 2015-02-01 — End: 2015-02-03

## 2015-02-01 MED ORDER — LEVOFLOXACIN IN D5W 500 MG/100ML IV SOLN
500.0000 mg | INTRAVENOUS | Status: DC
  Administered 2015-02-02 – 2015-02-03 (×2): 500 mg via INTRAVENOUS
  Filled 2015-02-01 (×2): qty 100

## 2015-02-01 MED ORDER — POTASSIUM CHLORIDE 20 MEQ PO PACK: 40.0000 meq | PACK | Freq: Once | ORAL | Status: DC

## 2015-02-01 MED ORDER — POTASSIUM CHLORIDE IN NACL 40-0.9 MEQ/L-% IV SOLN
INTRAVENOUS | Status: AC
  Administered 2015-02-02: 125 mL/h via INTRAVENOUS
  Filled 2015-02-01 (×3): qty 1000

## 2015-02-01 MED ORDER — POTASSIUM CHLORIDE CRYS ER 20 MEQ PO TBCR
40.0000 meq | EXTENDED_RELEASE_TABLET | Freq: Once | ORAL | Status: AC
  Administered 2015-02-01: 40 meq via ORAL
  Filled 2015-02-01: qty 2

## 2015-02-01 MED ORDER — METHYLPREDNISOLONE SODIUM SUCC 125 MG IJ SOLR
80.0000 mg | Freq: Four times a day (QID) | INTRAMUSCULAR | Status: DC
  Administered 2015-02-02 – 2015-02-03 (×6): 80 mg via INTRAVENOUS
  Filled 2015-02-01 (×6): qty 2

## 2015-02-01 MED ORDER — ALBUTEROL SULFATE (2.5 MG/3ML) 0.083% IN NEBU
2.5000 mg | INHALATION_SOLUTION | RESPIRATORY_TRACT | Status: DC | PRN
  Administered 2015-02-02: 2.5 mg via RESPIRATORY_TRACT
  Filled 2015-02-01: qty 3

## 2015-02-01 MED ORDER — ALBUTEROL SULFATE (2.5 MG/3ML) 0.083% IN NEBU
2.5000 mg | INHALATION_SOLUTION | Freq: Four times a day (QID) | RESPIRATORY_TRACT | Status: DC
  Administered 2015-02-02: 2.5 mg via RESPIRATORY_TRACT
  Filled 2015-02-01: qty 3

## 2015-02-01 MED ORDER — ENOXAPARIN SODIUM 40 MG/0.4ML ~~LOC~~ SOLN
40.0000 mg | SUBCUTANEOUS | Status: DC
  Administered 2015-02-02: 40 mg via SUBCUTANEOUS
  Filled 2015-02-01: qty 0.4

## 2015-02-01 MED ORDER — PANTOPRAZOLE SODIUM 40 MG PO TBEC
40.0000 mg | DELAYED_RELEASE_TABLET | Freq: Every day | ORAL | Status: DC
  Administered 2015-02-02: 40 mg via ORAL
  Filled 2015-02-01: qty 1

## 2015-02-01 MED ORDER — IPRATROPIUM BROMIDE 0.02 % IN SOLN
0.5000 mg | Freq: Four times a day (QID) | RESPIRATORY_TRACT | Status: DC
  Administered 2015-02-02: 0.5 mg via RESPIRATORY_TRACT
  Filled 2015-02-01: qty 2.5

## 2015-02-01 NOTE — ED Provider Notes (Signed)
CSN: [id number]     Arrival date & time 02/01/15  1901 History   First MD Initiated Contact with Patient 02/01/15 1903     Chief Complaint  Patient presents with  . Shortness of Breath     (Consider location/radiation/quality/duration/timing/severity/associated sxs/prior Treatment) Patient is a 29 y.o. male presenting with shortness of breath.  Shortness of Breath Severity:  Severe Onset quality:  Gradual Duration:  12 hours Timing:  Constant Progression:  Worsening Chronicity:  Recurrent Context: URI   Relieved by:  Nothing Worsened by:  Nothing tried Ineffective treatments:  None tried Associated symptoms: cough and sputum production   Associated symptoms: no fever     Past Medical History  Diagnosis Date  . Asthma    Past Surgical History  Procedure Laterality Date  . Eye surgery     Family History  Problem Relation Age of Onset  . Diabetes Father    Social History  Substance Use Topics  . Smoking status: Current Every Day Smoker    Last Attempt to Quit: 09/17/2008  . Smokeless tobacco: None  . Alcohol Use: Yes     Comment: occasionally    Review of Systems  Constitutional: Negative for fever.  Respiratory: Positive for cough, sputum production and shortness of breath.       Allergies  Review of patient's allergies indicates no known allergies.  Home Medications   Prior to Admission medications   Medication Sig Start Date End Date Taking? Authorizing Provider  albuterol (PROVENTIL HFA;VENTOLIN HFA) 108 (90 BASE) MCG/ACT inhaler Inhale 2 puffs into the lungs every 4 (four) hours as needed for wheezing or shortness of breath. 03/26/14   Harle Battiest, NP  azithromycin (ZITHROMAX) 250 MG tablet Take all 4 tabs po stat 11/12/13   Hayden Rasmussen, NP  ibuprofen (ADVIL,MOTRIN) 800 MG tablet Take 1 tablet (800 mg total) by mouth 3 (three) times daily. 08/17/13   Roxy Horseman, PA-C  predniSONE (DELTASONE) 20 MG tablet Take 2 tablets (40 mg total) by mouth  daily. 03/26/14   Harle Battiest, NP   BP 153/94 mmHg  Pulse 98  Temp(Src) 98.7 F (37.1 C) (Axillary)  Resp 36  Ht  (1.753 m)  Wt 145 lb (65.772 kg)  BMI 21.40 kg/m2  SpO2 99% Physical Exam  Constitutional: He is oriented to person, place, and time. He appears well-developed and well-nourished. He appears distressed.  HENT:  Head: Normocephalic and atraumatic.  Eyes: EOM are normal.  Neck: Normal range of motion.  Cardiovascular: Tachycardia present.   No murmur heard. Pulmonary/Chest: He is in respiratory distress. He has wheezes.  Musculoskeletal: He exhibits no edema.  Neurological: He is alert and oriented to person, place, and time.  Skin: No rash noted. He is not diaphoretic.    ED Course  Procedures (including critical care time) Labs Review Labs Reviewed  CBC WITH DIFFERENTIAL/PLATELET - Abnormal; Notable for the following:    Neutrophils Relative % 91 (*)    Neutro Abs 9.5 (*)    Lymphocytes Relative 6 (*)    Lymphs Abs 0.6 (*)    Monocytes Relative 2 (*)    All other components within normal limits  BASIC METABOLIC PANEL  PHOSPHORUS  MAGNESIUM    Imaging Review Dg Chest Portable 1 View  02/01/2015   CLINICAL DATA:  Shortness of breath.  EXAM: PORTABLE CHEST - 1 VIEW  COMPARISON:  August 17, 2013.  FINDINGS: The heart size and mediastinal contours are within normal limits. Both lungs are clear. No  pneumothorax or pleural effusion is noted. The visualized skeletal structures are unremarkable.  IMPRESSION: No acute cardiopulmonary abnormality seen.   Electronically Signed   By: Lupita Raider, M.D.   On: 02/01/2015 20:10   I have personally reviewed and evaluated these images and lab results as part of my medical decision-making.   EKG Interpretation None      MDM   Final diagnoses:  Asthma exacerbation     Patient is a 29 year old male with a history of asthma that presents with 8 hours of increased shortness of breath. Patient had a URI  starting yesterday. EMS was called and at that time they gave him DuoNeb, Solu-Medrol, magnesium with little improvement. Patient was placed on CPAP and had significant improvement. Upon arrival to the ED the patient is with increased work of breathing however more comfortable on BiPAP. Patient was started on a continuous breathing treatment and on reassessment patient is breathing easier and wants to try continuous breathing treatment without BiPAP. Patient's chest x-ray is unremarkable. Patient will be admitted to stepdown unit for further management and care.    Beverely Risen, MD 02/01/15 2138  Nelva Nay, MD 02/08/15 [phone]

## 2015-02-01 NOTE — ED Notes (Signed)
PT FEELING BETTER.
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
## 2015-02-01 NOTE — H&P (Signed)
[facility] History and Physical  [name] WJX:[id number] DOB: [birth year] DOA: 02/01/2015  Referring physician: Beverely Risen, MD. PCP: Default, Provider, MD   Chief Complaint: Shortness of breath.   HPI: [name] is a 29 y.o. male  with past medical history of asthma who comes with a two day history of progressively worse fatigue, dyspnea, wheezing and productive cough since yesterday. Per patient, until Friday he was in his usual state of health and did not have any symptoms at all. Then the on Saturday, he started having dry cough, and later became a productive cough along with wheezing and progressive dyspnea. He denies fever, chills, but feels tired and fatigued. At home, also his baby girl was having cold-like symptoms. Today his symptoms were so bad that EMS had to be called. On route to the hospital, the patient received bronchodilators, oxygen, Solu-Medrol and IV magnesium. He was put on BiPAP ventilation here in the hospital and is doing better. He is currently in no acute distress and speaking in full sentences, but remains on BiPAP.  Review of Systems:  Constitutional:   Positive fatigue. No weight loss, night sweats, Fevers, chills.  HEENT:  No headaches, Difficulty swallowing,Tooth/dental problems,Sore throat,  No sneezing, itching, ear ache, nasal congestion, post nasal drip,  Cardio-vascular:  No chest pain, Orthopnea, PND, swelling in lower extremities, anasarca, dizziness, palpitations  GI:  No heartburn, indigestion, abdominal pain, nausea, vomiting, diarrhea, change in bowel habits, loss of appetite  Resp:  shortness of breath with exertion or at rest, wheezing. Positive productive cough, No coughing up of blood. No chest wall deformity. Skin:  no rash or lesions.  GU:  no dysuria, change in color of urine, no urgency or frequency. No flank pain.  Musculoskeletal:  No joint pain or swelling. No decreased range of motion. No back pain.    Psych:  No change in mood or affect. No depression or anxiety. No memory loss.   Past Medical History  Diagnosis Date  . Asthma    Past Surgical History  Procedure Laterality Date  . Eye surgery     Social History:  reports that he has been smoking.  He does not have any smokeless tobacco history on file. He reports that he drinks alcohol. He reports that he does not use illicit drugs.  No Known Allergies  Family History  Problem Relation Age of Onset  . Diabetes Father     Prior to Admission medications   Medication Sig Start Date End Date Taking? Authorizing Provider  albuterol (PROVENTIL HFA;VENTOLIN HFA) 108 (90 BASE) MCG/ACT inhaler Inhale 2 puffs into the lungs every 4 (four) hours as needed for wheezing or shortness of breath. 03/26/14   Harle Battiest, NP  azithromycin (ZITHROMAX) 250 MG tablet Take all 4 tabs po stat 11/12/13   Hayden Rasmussen, NP  ibuprofen (ADVIL,MOTRIN) 800 MG tablet Take 1 tablet (800 mg total) by mouth 3 (three) times daily. 08/17/13   Roxy Horseman, PA-C  predniSONE (DELTASONE) 20 MG tablet Take 2 tablets (40 mg total) by mouth daily. 03/26/14   Harle Battiest, NP   Physical Exam: Filed Vitals:   02/01/15 1928 02/01/15 1930 02/01/15 1940 02/01/15 2030  BP:  153/94  140/90  Pulse: 101 98  82  Temp:      TempSrc:      Resp: 30 31 36 14  Height:      Weight:      SpO2: 97% 99%  100%    Wt  Readings from Last 3 Encounters:  02/01/15 65.772 kg (145 lb)  03/26/14 73.483 kg (162 lb)    General:  Appears calm and comfortable on BiPAP ventilation.  Eyes: PERRL, normal lids, irises & conjunctiva ENT: grossly normal hearing, lips & tongue Neck: no LAD, masses or thyromegaly Cardiovascular: Tachycardic, no m/r/g. No LE edema. Telemetry: Sinus tachycardia.  Respiratory: Mild wheezing bilateral.  Abdomen: soft, ntnd Skin: no rash or induration seen on limited exam Musculoskeletal: grossly normal tone BUE/BLE Psychiatric: grossly normal mood  and affect, speech fluent and appropriate Neurologic: grossly non-focal.          Labs on Admission:  Basic Metabolic Panel:  Recent Labs Lab 02/01/15 2031  NA 139  K 3.1*  CL 102  CO2 27  GLUCOSE 110*  BUN 13  CREATININE 1.31*  CALCIUM 9.5   CBC:  Recent Labs Lab 02/01/15 2031  WBC 10.4  NEUTROABS 9.5*  HGB 16.3  HCT 48.2  MCV 87.2  PLT 161    Radiological Exams on Admission: Dg Chest Portable 1 View  02/01/2015   CLINICAL DATA:  Shortness of breath.  EXAM: PORTABLE CHEST - 1 VIEW  COMPARISON:  August 17, 2013.  FINDINGS: The heart size and mediastinal contours are within normal limits. Both lungs are clear. No pneumothorax or pleural effusion is noted. The visualized skeletal structures are unremarkable.  IMPRESSION: No acute cardiopulmonary abnormality seen.   Electronically Signed   By: Lupita Raider, M.D.   On: 02/01/2015 20:10    EKG: Independently reviewed. Vent. rate 97 BPM PR interval 141 ms QRS duration 73 ms QT/QTc 342/434 ms P-R-T axes 79 90 53 Sinus rhythm Right atrial enlargement Borderline right axis deviation  Assessment/Plan Principal Problem:   Asthma exacerbation Admit to stepdown. Continue BiPAP ventilation. Continue bronchodilators. Continue solumedrol. Start Levaquin.  Active Problems:   Hypokalemia Replacing. Continue telemetry monitoring.    Code Status: Full code. DVT Prophylaxis: Lovenox SQ. Family Communication:  Disposition Plan:   Time spent: Over 70 minutes were spent.   Bobette Mo [facility] Pager [phone]

## 2015-02-01 NOTE — ED Notes (Signed)
Rep;ort given to rn on 2h 

## 2015-02-01 NOTE — ED Notes (Signed)
The pt reports that he still smokes.  He woke up around  1815 sib  He took 2 hhns.  The fire dept gave him 2 hhns and [facility] gave him 2 hhns enroute.   Skin war and dry

## 2015-02-01 NOTE — ED Notes (Signed)
Continuous  hhn placed  by resp therapy

## 2015-02-01 NOTE — ED Notes (Signed)
He feels much better  sats low still on 3 liters nasal 02.  C/o his nose burning.  Unable to lower the liter yet due to his low sats

## 2015-02-01 NOTE — ED Notes (Signed)
The pt arrived on c-pap and switched to bi-pap on arrival to the ed  He has had 6 hhns  Solu-medrol 125  Mag sulfate 4gmsiv prior to arrival here

## 2015-02-01 NOTE — ED Notes (Signed)
The pt reports that he feels better 

## 2015-02-02 ENCOUNTER — Encounter (HOSPITAL_COMMUNITY): Payer: Self-pay | Admitting: Internal Medicine

## 2015-02-02 DIAGNOSIS — N179 Acute kidney failure, unspecified: Secondary | ICD-10-CM | POA: Insufficient documentation

## 2015-02-02 DIAGNOSIS — F1910 Other psychoactive substance abuse, uncomplicated: Secondary | ICD-10-CM | POA: Insufficient documentation

## 2015-02-02 LAB — HEPATIC FUNCTION PANEL
ALBUMIN: 4.5 g/dL (ref 3.5–5.0)
ALK PHOS: 43 U/L (ref 38–126)
ALT: 17 U/L (ref 17–63)
AST: 32 U/L (ref 15–41)
BILIRUBIN INDIRECT: 0.8 mg/dL (ref 0.3–0.9)
Bilirubin, Direct: 0.1 mg/dL (ref 0.1–0.5)
TOTAL PROTEIN: 7.8 g/dL (ref 6.5–8.1)
Total Bilirubin: 0.9 mg/dL (ref 0.3–1.2)

## 2015-02-02 LAB — COMPREHENSIVE METABOLIC PANEL
ALBUMIN: 4.4 g/dL (ref 3.5–5.0)
ALT: 19 U/L (ref 17–63)
AST: 30 U/L (ref 15–41)
Alkaline Phosphatase: 44 U/L (ref 38–126)
Anion gap: 12 (ref 5–15)
BUN: 16 mg/dL (ref 6–20)
CHLORIDE: 103 mmol/L (ref 101–111)
CO2: 21 mmol/L — AB (ref 22–32)
CREATININE: 1.34 mg/dL — AB (ref 0.61–1.24)
Calcium: 9.2 mg/dL (ref 8.9–10.3)
GFR calc Af Amer: >60 mL/min (ref >60)
GLUCOSE: 147 mg/dL — AB (ref 65–99)
POTASSIUM: 4.3 mmol/L (ref 3.5–5.1)
SODIUM: 136 mmol/L (ref 135–145)
Total Bilirubin: 1 mg/dL (ref 0.3–1.2)
Total Protein: 7.5 g/dL (ref 6.5–8.1)

## 2015-02-02 LAB — CBC WITH DIFFERENTIAL/PLATELET
Basophils Absolute: 0 10*3/uL (ref 0.0–0.1)
Basophils Relative: 0 % (ref 0–1)
EOS ABS: 0 10*3/uL (ref 0.0–0.7)
EOS PCT: 0 % (ref 0–5)
HCT: 46 % (ref 39.0–52.0)
Hemoglobin: 15.9 g/dL (ref 13.0–17.0)
LYMPHS ABS: 0.2 10*3/uL — AB (ref 0.7–4.0)
LYMPHS PCT: 3 % — AB (ref 12–46)
MCH: 29.9 pg (ref 26.0–34.0)
MCHC: 34.6 g/dL (ref 30.0–36.0)
MCV: 86.6 fL (ref 78.0–100.0)
MONOS PCT: 1 % — AB (ref 3–12)
Monocytes Absolute: 0.1 10*3/uL (ref 0.1–1.0)
Neutro Abs: 8.5 10*3/uL — ABNORMAL HIGH (ref 1.7–7.7)
Neutrophils Relative %: 96 % — ABNORMAL HIGH (ref 43–77)
PLATELETS: 177 10*3/uL (ref 150–400)
RBC: 5.31 MIL/uL (ref 4.22–5.81)
RDW: 13.9 % (ref 11.5–15.5)
WBC: 8.7 10*3/uL (ref 4.0–10.5)

## 2015-02-02 LAB — CBC
HEMATOCRIT: 47 % (ref 39.0–52.0)
HEMOGLOBIN: 16 g/dL (ref 13.0–17.0)
MCH: 29.5 pg (ref 26.0–34.0)
MCHC: 34 g/dL (ref 30.0–36.0)
MCV: 86.7 fL (ref 78.0–100.0)
Platelets: 132 10*3/uL — ABNORMAL LOW (ref 150–400)
RBC: 5.42 MIL/uL (ref 4.22–5.81)
RDW: 13.8 % (ref 11.5–15.5)
WBC: 9.5 10*3/uL (ref 4.0–10.5)

## 2015-02-02 LAB — RAPID URINE DRUG SCREEN, HOSP PERFORMED
AMPHETAMINES: NOT DETECTED
BARBITURATES: NOT DETECTED
BENZODIAZEPINES: NOT DETECTED
Cocaine: NOT DETECTED
Opiates: POSITIVE — AB
Tetrahydrocannabinol: POSITIVE — AB

## 2015-02-02 LAB — MAGNESIUM: MAGNESIUM: 2 mg/dL (ref 1.7–2.4)

## 2015-02-02 LAB — CREATININE, SERUM: Creatinine, Ser: 1.46 mg/dL — ABNORMAL HIGH (ref 0.61–1.24)

## 2015-02-02 LAB — PHOSPHORUS: Phosphorus: 2.2 mg/dL — ABNORMAL LOW (ref 2.5–4.6)

## 2015-02-02 LAB — MRSA PCR SCREENING: MRSA by PCR: NEGATIVE

## 2015-02-02 MED ORDER — BOOST / RESOURCE BREEZE PO LIQD
1.0000 | Freq: Three times a day (TID) | ORAL | Status: DC
  Administered 2015-02-02 (×2): 1 via ORAL

## 2015-02-02 MED ORDER — ALPRAZOLAM 0.5 MG PO TABS
0.5000 mg | ORAL_TABLET | Freq: Every evening | ORAL | Status: DC | PRN
  Administered 2015-02-02: 0.5 mg via ORAL
  Filled 2015-02-02: qty 1

## 2015-02-02 MED ORDER — IPRATROPIUM-ALBUTEROL 0.5-2.5 (3) MG/3ML IN SOLN
3.0000 mL | Freq: Four times a day (QID) | RESPIRATORY_TRACT | Status: DC
  Administered 2015-02-02 – 2015-02-03 (×5): 3 mL via RESPIRATORY_TRACT
  Filled 2015-02-02 (×5): qty 3

## 2015-02-02 NOTE — Progress Notes (Signed)
[facility]                                                                              Patient Demographics  [name], is a 29 y.o. male, DOB - June 22, 1985, RUE:[id number]  Admit date - 02/01/2015   Admitting Physician Bobette Mo, MD  Outpatient Primary MD for the patient is Default, Provider, MD  LOS - 1   Chief Complaint  Patient presents with  . Shortness of Breath      HPI on 02/01/2015 by Dr. Onalee Hua Manual [name] is a 29 y.o. male with past medical history of asthma who comes with a two day history of progressively worse fatigue, dyspnea, wheezing and productive cough since yesterday. Per patient, until Friday he was in his usual state of health and did not have any symptoms at all. Then the on Saturday, he started having dry cough, and later became a productive cough along with wheezing and progressive dyspnea. He denies fever, chills, but feels tired and fatigued. At home, also his baby girl was having cold-like symptoms. Today his symptoms were so bad that EMS had to be called. On route to the hospital, the patient received bronchodilators, oxygen, Solu-Medrol and IV magnesium. He was put on BiPAP ventilation here in the hospital and is doing better. He is currently in no acute distress and speaking in full sentences, but remains on BiPAP.  Assessment & Plan   Asthma exacerbation -Patient continues to need BiPAP -Continue Cymetra, bronchodilation, Levaquin -Chest x-ray shows no acute cardiopulmonary abnormality  Hypokalemia -Resolved, continue monitor BMP and replace as needed   Acute kidney injury versus ? chronic kidney disease -Creatinine currently 1.34 -Last creatinine in 2009 was ~1 -Continue to monitor BMP, avoid nephrotoxic medications -Monitor urine output  Polysubstance abuse -Tox screen positive for opiates and THC -Patient counseled regarding smoking cessation as well.  Code Status: Full   Family Communication:  Wife at bedside   Disposition Plan: Admitted. Continue to monitor and stepdown. Will attempt to wean off of BiPAP   Time Spent in minutes   30 minutes   Procedures  None   Consults   None   DVT Prophylaxis  Lovenox   Lab Results  Component Value Date   PLT 177 02/02/2015    Medications  Scheduled Meds: . enoxaparin (LOVENOX) injection  40 mg Subcutaneous Q24H  . guaiFENesin  600 mg Oral BID  . ipratropium-albuterol  3 mL Nebulization Q6H  . levofloxacin (LEVAQUIN) IV  500 mg Intravenous Q24H  . methylPREDNISolone (SOLU-MEDROL) injection  80 mg Intravenous Q6H  . pantoprazole  40 mg Oral Daily   Continuous Infusions: . 0.9 % NaCl with KCl 40 mEq / L 125 mL/hr (02/02/15 0044)   PRN Meds:.albuterol, ALPRAZolam, ondansetron **OR** ondansetron (ZOFRAN) IV  Antibiotics    Anti-infectives    Start     Dose/Rate Route Frequency Ordered Stop   02/02/15 0000  levofloxacin (LEVAQUIN) IVPB 500 mg     500 mg 100 mL/hr over 60 Minutes Intravenous Every 24 hours 02/01/15 2350          Subjective:   [name] seen and examined today.  Continues to  have shortness of breath. Continues to wear BiPAP. Admits to using marijuana recently apparently 4 days ago. Denies any chest pain, headache, dizziness, abdominal pain.   Objective:   Filed Vitals:   02/02/15 0506 02/02/15 0734 02/02/15 0833 02/02/15 1152  BP: 121/66 137/89  145/74  Pulse:  75 97 93  Temp:  97.5 F (36.4 C)  97.6 F (36.4 C)  TempSrc:  Axillary  Axillary  Resp:  Height:      Weight:      SpO2: 98% 100% 100% 100%    Wt Readings from Last 3 Encounters:  02/01/15 62 kg (136 lb 11 oz)  03/26/14 73.483 kg (162 lb)     Intake/Output Summary (Last 24 hours) at 02/02/15 1249 Last data filed at 02/02/15 0900  Gross per 24 hour  Intake 1178.33 ml  Output    725 ml  Net 453.33 ml    Exam  General: Well developed, well nourished, NAD, appears stated age  HEENT: NCATBiPAP in  place  Cardiovascular: S1 S2 auscultated, no rubs, murmurs or gallops. Regular rate and rhythm.  RespiratoryParticularly wheezing, diminished breath sounds  Abdomen: Soft, nontender, nondistended, + bowel sounds  Extremities: warm dry without cyanosis clubbing or edema  Neuro: AAOx3nonfocal  Psych: Normal affect and demeanor   Data Review   Micro Results Recent Results (from the past 240 hour(s))  MRSA PCR Screening     Status: None   Collection Time: 02/02/15 12:49 AM  Result Value Ref Range Status   MRSA by PCR NEGATIVE NEGATIVE Final    Comment:        The GeneXpert MRSA Assay (FDA approved for NASAL specimens only), is one component of a comprehensive MRSA colonization surveillance program. It is not intended to diagnose MRSA infection nor to guide or monitor treatment for MRSA infections.     Radiology Reports Dg Chest Portable 1 View  02/01/2015   CLINICAL DATA:  Shortness of breath.  EXAM: PORTABLE CHEST - 1 VIEW  COMPARISON:  August 17, 2013.  FINDINGS: The heart size and mediastinal contours are within normal limits. Both lungs are clear. No pneumothorax or pleural effusion is noted. The visualized skeletal structures are unremarkable.  IMPRESSION: No acute cardiopulmonary abnormality seen.   Electronically Signed   By: Lupita Raider, M.D.   On: 02/01/2015 20:10    CBC  Recent Labs Lab 02/01/15 2031 02/02/15 0025 02/02/15 0222  WBC 10.4 9.5 8.7  HGB 16.3 16.0 15.9  HCT 48.2 47.0 46.0  PLT 161 132* 177  MCV 87.2 86.7 86.6  MCH 29.5 29.5 29.9  MCHC 33.8 34.0 34.6  RDW 13.7 13.8 13.9  LYMPHSABS 0.6*  --  0.2*  MONOABS 0.2  --  0.1  EOSABS 0.1  --  0.0  BASOSABS 0.0  --  0.0    Chemistries   Recent Labs Lab 02/01/15 2031 02/02/15 0025 02/02/15 0222  NA 139  --  136  K 3.1*  --  4.3  CL 102  --  103  CO2 27  --  21*  GLUCOSE 110*  --  147*  BUN 13  --  16  CREATININE 1.31* 1.46* 1.34*  CALCIUM 9.5  --  9.2  MG  --  2.0  --   AST  --   32 30  ALT  --  17 19  ALKPHOS  --  43 44  BILITOT  --  0.9 1.0   ------------------------------------------------------------------------------------------------------------------ estimated creatinine clearance is  70 mL/min (by C-G formula based on Cr of 1.34). ------------------------------------------------------------------------------------------------------------------ No results for input(s): HGBA1C in the last 72 hours. ------------------------------------------------------------------------------------------------------------------ No results for input(s): CHOL, HDL, LDLCALC, TRIG, CHOLHDL, LDLDIRECT in the last 72 hours. ------------------------------------------------------------------------------------------------------------------ No results for input(s): TSH, T4TOTAL, T3FREE, THYROIDAB in the last 72 hours.  Invalid input(s): FREET3 ------------------------------------------------------------------------------------------------------------------ No results for input(s): VITAMINB12, FOLATE, FERRITIN, TIBC, IRON, RETICCTPCT in the last 72 hours.  Coagulation profile No results for input(s): INR, PROTIME in the last 168 hours.  No results for input(s): DDIMER in the last 72 hours.  Cardiac Enzymes No results for input(s): CKMB, TROPONINI, MYOGLOBIN in the last 168 hours.  Invalid input(s): CK ------------------------------------------------------------------------------------------------------------------ Invalid input(s): POCBNP    Atha Muradyan D.O. on 02/02/2015 at 12:49 PM  Between 7am to 7pm - Pager - [phone]  After 7pm go to [email or web address] - password [email or web address]  And look for the night coverage person covering for me after hours  [facility] Office  [phone]

## 2015-02-02 NOTE — Care Management Note (Signed)
Case Management Note  Patient Details  Name: [name] MRN: [id number] Date of Birth: December 24, 1985  Subjective/Objective:     Adm w asthma exacerb, bipap               Action/Plan:lives w fam   Expected Discharge Date:                 Expected Discharge Plan:     In-House Referral:     Discharge planning Services     Post Acute Care Choice:    Choice offered to:     DME Arranged:    DME Agency:     HH Arranged:    HH Agency:     Status of Service:     Medicare Important Message Given:    Date Medicare IM Given:    Medicare IM give by:    Date Additional Medicare IM Given:    Additional Medicare Important Message give by:     If discussed at Long Length of Stay Meetings, dates discussed:    Additional Comments: ur review done  Hanley Hays, RN 02/02/2015, 9:33 AM

## 2015-02-02 NOTE — Progress Notes (Signed)
RT accidentally charted another patient's ventilator and airway settings on this patient. I went back and deleted the incorrect information.

## 2015-02-02 NOTE — Progress Notes (Addendum)
Initial Nutrition Assessment  DOCUMENTATION CODES:   Severe malnutrition in context of chronic illness  INTERVENTION:   Boost Breeze po TID, each supplement provides 250 kcal and 9 grams of protein  NUTRITION DIAGNOSIS:   Malnutrition related to chronic illness as evidenced by energy intake < or equal to 75% for > or equal to 1 month, moderate depletion of body fat, percent weight loss.   GOAL:   Patient will meet greater than or equal to 90% of their needs   MONITOR:   PO intake, Supplement acceptance, Labs, Weight trends  REASON FOR ASSESSMENT:   Malnutrition Screening Tool    ASSESSMENT:   Pt with hx of asthma admitted with acute exacerbation.  Pt currently on non-rebreather but on bipap earlier.  Per pt he has been losing weight for the last 6-7 months (has lost 17% of his body weight or almost 30 lb). He reports that this began when he "got in trouble" and was stressed but then he "got out of it" but his appetite never returned. 24 hr recall: Breakfast: none; Lunch: none, 3-4 pm pt has a sandwich or a fast food meal, Dinner (8-9 pm): home cooked meal Per pt his PCP has been concerned about his weight loss and has ran many labs, renal panel, HIV, etc but has not found a cause.  Nutrition-Focused physical exam completed. Findings are mild/moderate fat depletion, no muscle depletion, and no edema.  Pt is willing to try [facility], reviewed alternatives as well.  Labs reviewed: phosphorus (2.2); cbg's: 136-162  Diet Order:  Diet regular Room service appropriate?: Yes; Fluid consistency:: Thin  Skin:  Reviewed, no issues  Last BM:  9/11  Height:   Ht Readings from Last 1 Encounters:  02/01/15  (1.753 m)    Weight:   Wt Readings from Last 1 Encounters:  02/01/15 136 lb 11 oz (62 kg)    Ideal Body Weight:  72.7 kg  BMI:  Body mass index is 20.18 kg/(m^2).  Estimated Nutritional Needs:   Kcal:  2000-2200  Protein:  90-110 grams  Fluid:  > 2  L/day  EDUCATION NEEDS:   No education needs identified at this time  Kendell Bane RD, LDN, CNSC [phone] Pager [phone] After Hours Pager

## 2015-02-02 NOTE — Progress Notes (Signed)
Pt bipap machine alarming.  Adjusted bipap mask.  RT called. Pt been sweating, changed bed.  Temp normal.   Later called back in room pt had mask off stating " can'[name] breathe"  Place on 3LNC and then RT placed back on bipap and gave neb treatment.  Wife at bedside. Will continue to monitor. [name]

## 2015-02-03 DIAGNOSIS — N179 Acute kidney failure, unspecified: Secondary | ICD-10-CM

## 2015-02-03 DIAGNOSIS — F1910 Other psychoactive substance abuse, uncomplicated: Secondary | ICD-10-CM

## 2015-02-03 DIAGNOSIS — E876 Hypokalemia: Secondary | ICD-10-CM

## 2015-02-03 DIAGNOSIS — J45901 Unspecified asthma with (acute) exacerbation: Principal | ICD-10-CM

## 2015-02-03 LAB — BASIC METABOLIC PANEL
ANION GAP: 11 (ref 5–15)
BUN: 13 mg/dL (ref 6–20)
CALCIUM: 9.4 mg/dL (ref 8.9–10.3)
CHLORIDE: 107 mmol/L (ref 101–111)
CO2: 21 mmol/L — AB (ref 22–32)
Creatinine, Ser: 1.06 mg/dL (ref 0.61–1.24)
GFR calc non Af Amer: >60 mL/min (ref >60)
Glucose, Bld: 134 mg/dL — ABNORMAL HIGH (ref 65–99)
Potassium: 4.1 mmol/L (ref 3.5–5.1)
SODIUM: 139 mmol/L (ref 135–145)

## 2015-02-03 LAB — CBC
HEMATOCRIT: 47.4 % (ref 39.0–52.0)
HEMOGLOBIN: 16.1 g/dL (ref 13.0–17.0)
MCH: 29.7 pg (ref 26.0–34.0)
MCHC: 34 g/dL (ref 30.0–36.0)
MCV: 87.3 fL (ref 78.0–100.0)
Platelets: 150 10*3/uL (ref 150–400)
RBC: 5.43 MIL/uL (ref 4.22–5.81)
RDW: 14.3 % (ref 11.5–15.5)
WBC: 16.5 10*3/uL — AB (ref 4.0–10.5)

## 2015-02-03 MED ORDER — IPRATROPIUM-ALBUTEROL 0.5-2.5 (3) MG/3ML IN SOLN: 3.0000 mL | Freq: Four times a day (QID) | RESPIRATORY_TRACT | Status: AC

## 2015-02-03 MED ORDER — LEVOFLOXACIN 500 MG PO TABS: 500.0000 mg | ORAL_TABLET | Freq: Every day | ORAL | Status: DC

## 2015-02-03 MED ORDER — PREDNISONE 10 MG PO TABS: ORAL_TABLET | ORAL | Status: DC

## 2015-02-03 MED ORDER — ALBUTEROL SULFATE (2.5 MG/3ML) 0.083% IN NEBU: 2.5000 mg | INHALATION_SOLUTION | RESPIRATORY_TRACT | Status: DC | PRN

## 2015-02-03 MED ORDER — ALBUTEROL SULFATE HFA 108 (90 BASE) MCG/ACT IN AERS: 2.0000 | INHALATION_SPRAY | Freq: Four times a day (QID) | RESPIRATORY_TRACT | Status: DC | PRN

## 2015-02-03 NOTE — Progress Notes (Signed)
Patient is showing no apparent signs of respiratory distress and requested to sleep without the Bipap. Vitals are stable (HR 83 RR 18 SpO2 97% on RA) RT will continue to monitor.

## 2015-02-03 NOTE — Discharge Summary (Addendum)
Physician Discharge Summary  [name] UJW:[id number] DOB: 01-03-1986 DOA: 02/01/2015  PCP: Default, Provider, MD  Admit date: 02/01/2015 Discharge date: 02/03/2015  Time spent: 45 minutes  Recommendations for Outpatient Follow-up:  Patient will be discharged to home.  Patient will need to follow up with primary care provider within one week of discharge, repeat CBC and BMP.  Patient should also establish care with pulmonology and have PFTs. Patient should continue medications as prescribed.  Patient should follow a regular diet.   Discharge Diagnoses:  Principal Problem:   Asthma exacerbation Active Problems:   Hypokalemia   Polysubstance abuse   AKI (acute kidney injury)   Discharge Condition: Stable  Diet recommendation: Regular  Filed Weights   02/01/15 1912 02/01/15 2350  Weight: 65.772 kg (145 lb) 62 kg (136 lb 11 oz)    History of present illness:  on 02/01/2015 by Dr. Onalee Hua [name] is a 29 y.o. male with past medical history of asthma who comes with a two day history of progressively worse fatigue, dyspnea, wheezing and productive cough since yesterday. Per patient, until Friday he was in his usual state of health and did not have any symptoms at all. Then the on Saturday, he started having dry cough, and later became a productive cough along with wheezing and progressive dyspnea. He denies fever, chills, but feels tired and fatigued. At home, also his baby girl was having cold-like symptoms. Today his symptoms were so bad that EMS had to be called. On route to the hospital, the patient received bronchodilators, oxygen, Solu-Medrol and IV magnesium. He was put on BiPAP ventilation here in the hospital and is doing better. He is currently in no acute distress and speaking in full sentences, but remains on BiPAP.  Hospital Course:  Asthma exacerbation -Patient was initially placed on BiPAP. Currently maintaining oxygen saturation in the  high 90s without oxygen. -Continue neb treatments, prednisone taper, Levaquin -Chest x-ray shows no acute cardiopulmonary abnormality  Leukocytosis -Likely reactive, as patient is on high dose of solumedrol -Repeat CBC in one week  Hypokalemia -Resolved, continue monitor BMP and replace as needed   Acute kidney injury versus ? chronic kidney disease -Creatinine currently 1.06 -Last creatinine in 2009 was ~1 -Continue to monitor BMP, avoid nephrotoxic medications -Monitor urine output, over past 24 hours: 1600cc  Polysubstance abuse/Tobacco abuse -Tox screen positive for opiates and THC -Patient counseled regarding smoking cessation as well.  Procedures  None   Consults  None   Discharge Exam: Filed Vitals:   02/03/15 0808  BP: 144/78  Pulse: 90  Temp: 97.5 F (36.4 C)  Resp: 20   Exam  General: Well developed, well nourished, NAD  HEENT: NCAT, mucous membranes moist  Cardiovascular: S1 S2 auscultated, RRR, no murmurs  Respiratory: Minimal wheezing, good air movement  Abdomen: Soft, nontender, nondistended, + bowel sounds  Extremities: warm dry without cyanosis clubbing or edema  Neuro: AAOx3nonfocal  Psych: Normal affect and demeanor, pleasant  Discharge Instructions      Discharge Instructions    Discharge instructions    Complete by:  As directed   Patient will be discharged to home.  Patient will need to follow up with primary care provider within one week of discharge, repeat CBC and BMP.  Patient should also establish care with pulmonology and have PFTs. Patient should continue medications as prescribed.  Patient should follow a regular diet.            Medication List  STOP taking these medications        albuterol 108 (90 BASE) MCG/ACT inhaler  Commonly known as:  PROVENTIL HFA;VENTOLIN HFA  Replaced by:  albuterol (2.5 MG/3ML) 0.083% nebulizer solution     azithromycin 250 MG tablet  Commonly known as:  ZITHROMAX     ibuprofen  800 MG tablet  Commonly known as:  ADVIL,MOTRIN      TAKE these medications        albuterol (2.5 MG/3ML) 0.083% nebulizer solution  Commonly known as:  PROVENTIL  Take 3 mLs (2.5 mg total) by nebulization every 2 (two) hours as needed for wheezing.     albuterol 108 (90 BASE) MCG/ACT inhaler  Commonly known as:  PROAIR HFA  Inhale 2 puffs into the lungs every 6 (six) hours as needed for wheezing or shortness of breath.     ipratropium-albuterol 0.5-2.5 (3) MG/3ML Soln  Commonly known as:  DUONEB  Take 3 mLs by nebulization every 6 (six) hours.     levofloxacin 500 MG tablet  Commonly known as:  LEVAQUIN  Take 1 tablet (500 mg total) by mouth daily.     predniSONE 10 MG tablet  Commonly known as:  DELTASONE  Take  Prednisone 40mg  (4 tabs) x 3 days, then taper to 30mg  (3 tabs) x 3 days, then 20mg  (2 tabs) x 3days, then 10mg  (1 tab) x 3days, then STOP       No Known Allergies Follow-up Information    Follow up with Primary care physician. Schedule an appointment as soon as possible for a visit in 1 week.   Why:  hospital follow up, repeat cbc and BMP, pulmonology referral      Follow up with [facility]. Schedule an appointment as soon as possible for a visit in 2 weeks.   Specialty:  Pulmonology   Why:  Asthma   Contact information:   [address] [place] [address] [phone]       The results of significant diagnostics from this hospitalization (including imaging, microbiology, ancillary and laboratory) are listed below for reference.    Significant Diagnostic Studies: Dg Chest Portable 1 View  02/01/2015   CLINICAL DATA:  Shortness of breath.  EXAM: PORTABLE CHEST - 1 VIEW  COMPARISON:  August 17, 2013.  FINDINGS: The heart size and mediastinal contours are within normal limits. Both lungs are clear. No pneumothorax or pleural effusion is noted. The visualized skeletal structures are unremarkable.  IMPRESSION: No acute cardiopulmonary  abnormality seen.   Electronically Signed   By: Lupita Raider, M.D.   On: 02/01/2015 20:10    Microbiology: Recent Results (from the past 240 hour(s))  MRSA PCR Screening     Status: None   Collection Time: 02/02/15 12:49 AM  Result Value Ref Range Status   MRSA by PCR NEGATIVE NEGATIVE Final    Comment:        The GeneXpert MRSA Assay (FDA approved for NASAL specimens only), is one component of a comprehensive MRSA colonization surveillance program. It is not intended to diagnose MRSA infection nor to guide or monitor treatment for MRSA infections.      Labs: Basic Metabolic Panel:  Recent Labs Lab 02/01/15 2031 02/02/15 0025 02/02/15 0222 02/03/15 0239  NA 139  --  136 139  K 3.1*  --  4.3 4.1  CL 102  --  103 107  CO2 27  --  21* 21*  GLUCOSE 110*  --  147* 134*  BUN 13  --  16 13  CREATININE 1.31* 1.46* 1.34* 1.06  CALCIUM 9.5  --  9.2 9.4  MG  --  2.0  --   --   PHOS  --  2.2*  --   --    Liver Function Tests:  Recent Labs Lab 02/02/15 0025 02/02/15 0222  AST 32 30  ALT 17 19  ALKPHOS 43 44  BILITOT 0.9 1.0  PROT 7.8 7.5  ALBUMIN 4.5 4.4   No results for input(s): LIPASE, AMYLASE in the last 168 hours. No results for input(s): AMMONIA in the last 168 hours. CBC:  Recent Labs Lab 02/01/15 2031 02/02/15 0025 02/02/15 0222 02/03/15 0239  WBC 10.4 9.5 8.7 16.5*  NEUTROABS 9.5*  --  8.5*  --   HGB 16.3 16.0 15.9 16.1  HCT 48.2 47.0 46.0 47.4  MCV 87.2 86.7 86.6 87.3  PLT 161 132* 177 150   Cardiac Enzymes: No results for input(s): CKTOTAL, CKMB, CKMBINDEX, TROPONINI in the last 168 hours. BNP: BNP (last 3 results) No results for input(s): BNP in the last 8760 hours.  ProBNP (last 3 results) No results for input(s): PROBNP in the last 8760 hours.  CBG: No results for input(s): GLUCAP in the last 168 hours.     SignedEdsel Petrin  [facility] 02/03/2015, 10:09 AM  Addendum: Severe malnutrition in the context of  chronic illness -Nutrition consulted and appreciated -Continue boost breeze supplements

## 2015-02-03 NOTE — Clinical Documentation Improvement (Signed)
Hospitalist  Can the diagnosis of Malnutrition be further specified?   Document Severity - Severe(third degree), Moderate (second degree), Mild (first degree)  Other condition  Unable to clinically determine  Document any associated diagnoses/conditions  Supporting Information:  Initial Nutrition Assessment by Arlyss Gandy, RD at 2015-02-28   DOCUMENTATION CODES:  Severe malnutrition in context of chronic illness  INTERVENTION:  Boost Breeze po TID, each supplement provides 250 kcal and 9 grams of protein  NUTRITION DIAGNOSIS:  Malnutrition related to chronic illness as evidenced by energy intake < or equal to 75% for > or equal to 1 month, moderate depletion of body fat, percent weight loss.   Please exercise your independent, professional judgment when responding. A specific answer is not anticipated or expected.   Thank You, Nevin Bloodgood, RN, BSN, CCDS,Clinical Documentation Specialist:  [phone]  [phone]=[employer] Information Management

## 2015-02-03 NOTE — Discharge Planning (Signed)
Patient given d/c instructions and verbalizes understanding. Script for prednisone given to patient. Pt with no complaints at the current time. PIV d/c'd catheter tip intact. Taken to private vehicle via [facility].

## 2015-02-03 NOTE — Discharge Instructions (Signed)
Asthma °Asthma is a condition of the lungs in which the airways tighten and narrow. Asthma can make it hard to breathe. Asthma cannot be cured, but medicine and lifestyle changes can help control it. Asthma may be started (triggered) by: °· Animal skin flakes (dander). °· Dust. °· Cockroaches. °· Pollen. °· Mold. °· Smoke. °· Cleaning products. °· Hair sprays or aerosol sprays. °· Paint fumes or strong smells. °· Cold air, weather changes, and winds. °· Crying or laughing hard. °· Stress. °· Certain medicines or drugs. °· Foods, such as dried fruit, potato chips, and sparkling grape juice. °· Infections or conditions (colds, flu). °· Exercise. °· Certain medical conditions or diseases. °· Exercise or tiring activities. °HOME CARE  °· Take medicine as told by your doctor. °· Use a peak flow meter as told by your doctor. A peak flow meter is a tool that measures how well the lungs are working. °· Record and keep track of the peak flow meter's readings. °· Understand and use the asthma action plan. An asthma action plan is a written plan for taking care of your asthma and treating your attacks. °· To help prevent asthma attacks: °· Do not smoke. Stay away from secondhand smoke. °· Change your heating and air conditioning filter often. °· Limit your use of fireplaces and wood stoves. °· Get rid of pests (such as roaches and mice) and their droppings. °· Throw away plants if you see mold on them. °· Clean your floors. Dust regularly. Use cleaning products that do not smell. °· Have someone vacuum when you are not home. Use a vacuum cleaner with a HEPA filter if possible. °· Replace carpet with wood, tile, or vinyl flooring. Carpet can trap animal skin flakes and dust. °· Use allergy-proof pillows, mattress covers, and box spring covers. °· Wash bed sheets and blankets every week in hot water and dry them in a dryer. °· Use blankets that are made of polyester or cotton. °· Clean bathrooms and kitchens with bleach. If  possible, have someone repaint the walls in these rooms with mold-resistant paint. Keep out of the rooms that are being cleaned and painted. °· Wash hands often. °GET HELP IF: °· You have make a whistling sound when breaking (wheeze), have shortness of breath, or have a cough even if taking medicine to prevent attacks. °· The colored mucus you cough up (sputum) is thicker than usual. °· The colored mucus you cough up changes from clear or white to yellow, green, gray, or bloody. °· You have problems from the medicine you are taking such as: °· A rash. °· Itching. °· Swelling. °· Trouble breathing. °· You need reliever medicines more than 2-3 times a week. °· Your peak flow measurement is still at 50-79% of your personal best after following the action plan for 1 hour. °· You have a fever. °GET HELP RIGHT AWAY IF:  °· You seem to be worse and are not responding to medicine during an asthma attack. °· You are short of breath even at rest. °· You get short of breath when doing very little activity. °· You have trouble eating, drinking, or talking. °· You have chest pain. °· You have a fast heartbeat. °· Your lips or fingernails start to turn blue. °· You are light-headed, dizzy, or faint. °· Your peak flow is less than 50% of your personal best. °MAKE SURE YOU:  °· Understand these instructions. °· Will watch your condition. °· Will get help right away if you   are not doing well or get worse. °Document Released: 10/26/2007 Document Revised: 09/23/2013 Document Reviewed: 12/06/2012 °ExitCare® Patient Information ©2015 [employer]. This information is not intended to replace advice given to you by your health care provider. Make sure you discuss any questions you have with your health care provider. °Smoking Cessation °Quitting smoking is important to your health and has many advantages. However, it is not always easy to quit since nicotine is a very addictive drug. Oftentimes, people try 3 times or more before being  able to quit. This document explains the best ways for you to prepare to quit smoking. Quitting takes hard work and a lot of effort, but you can do it. °ADVANTAGES OF QUITTING SMOKING °· You will live longer, feel better, and live better. °· Your body will feel the impact of quitting smoking almost immediately. °¨ Within 20 minutes, blood pressure decreases. Your pulse returns to its normal level. °¨ After 8 hours, carbon monoxide levels in the blood return to normal. Your oxygen level increases. °¨ After 24 hours, the chance of having a heart attack starts to decrease. Your breath, hair, and body stop smelling like smoke. °¨ After 48 hours, damaged nerve endings begin to recover. Your sense of taste and smell improve. °¨ After 72 hours, the body is virtually free of nicotine. Your bronchial tubes relax and breathing becomes easier. °¨ After 2 to 12 weeks, lungs can hold more air. Exercise becomes easier and circulation improves. °· The risk of having a heart attack, stroke, cancer, or lung disease is greatly reduced. °¨ After 1 year, the risk of coronary heart disease is cut in half. °¨ After 5 years, the risk of stroke falls to the same as a nonsmoker. °¨ After 10 years, the risk of lung cancer is cut in half and the risk of other cancers decreases significantly. °¨ After 15 years, the risk of coronary heart disease drops, usually to the level of a nonsmoker. °· If you are pregnant, quitting smoking will improve your chances of having a healthy baby. °· The people you live with, especially any children, will be healthier. °· You will have extra money to spend on things other than cigarettes. °QUESTIONS TO THINK ABOUT BEFORE ATTEMPTING TO QUIT °You may want to talk about your answers with your health care provider. °· Why do you want to quit? °· If you tried to quit in the past, what helped and what did not? °· What will be the most difficult situations for you after you quit? How will you plan to handle  them? °· Who can help you through the tough times? Your family? Friends? A health care provider? °· What pleasures do you get from smoking? What ways can you still get pleasure if you quit? °Here are some questions to ask your health care provider: °· How can you help me to be successful at quitting? °· What medicine do you think would be best for me and how should I take it? °· What should I do if I need more help? °· What is smoking withdrawal like? How can I get information on withdrawal? °GET READY °· Set a quit date. °· Change your environment by getting rid of all cigarettes, ashtrays, matches, and lighters in your home, car, or work. Do not let people smoke in your home. °· Review your past attempts to quit. Think about what worked and what did not. °GET SUPPORT AND ENCOURAGEMENT °You have a better chance of being successful if you   have help. You can get support in many ways.  Tell your family, friends, and coworkers that you are going to quit and need their support. Ask them not to smoke around you.  Get individual, group, or telephone counseling and support. Programs are available at [facility] and [facility]. Call your local health department for information about programs in your area.  Spiritual beliefs and practices may help some smokers quit.  Download a "quit meter" on your computer to keep track of quit statistics, such as how long you have gone without smoking, cigarettes not smoked, and money saved.  Get a self-help book about quitting smoking and staying off tobacco. LEARN NEW SKILLS AND BEHAVIORS  Distract yourself from urges to smoke. Talk to someone, go for a walk, or occupy your time with a task.  Change your normal routine. Take a different route to work. Drink tea instead of coffee. Eat breakfast in a different place.  Reduce your stress. Take a hot bath, exercise, or read a book.  Plan something enjoyable to do every day. Reward yourself for not  smoking.  Explore interactive web-based programs that specialize in helping you quit. GET MEDICINE AND USE IT CORRECTLY Medicines can help you stop smoking and decrease the urge to smoke. Combining medicine with the above behavioral methods and support can greatly increase your chances of successfully quitting smoking.  Nicotine replacement therapy helps deliver nicotine to your body without the negative effects and risks of smoking. Nicotine replacement therapy includes nicotine gum, lozenges, inhalers, nasal sprays, and skin patches. Some may be available over-the-counter and others require a prescription.  Antidepressant medicine helps people abstain from smoking, but how this works is unknown. This medicine is available by prescription.  Nicotinic receptor partial agonist medicine simulates the effect of nicotine in your brain. This medicine is available by prescription. Ask your health care provider for advice about which medicines to use and how to use them based on your health history. Your health care provider will tell you what side effects to look out for if you choose to be on a medicine or therapy. Carefully read the information on the package. Do not use any other product containing nicotine while using a nicotine replacement product.  RELAPSE OR DIFFICULT SITUATIONS Most relapses occur within the first 3 months after quitting. Do not be discouraged if you start smoking again. Remember, most people try several times before finally quitting. You may have symptoms of withdrawal because your body is used to nicotine. You may crave cigarettes, be irritable, feel very hungry, cough often, get headaches, or have difficulty concentrating. The withdrawal symptoms are only temporary. They are strongest when you first quit, but they will go away within 10-14 days. To reduce the chances of relapse, try to:  Avoid drinking alcohol. Drinking lowers your chances of successfully quitting.  Reduce the  amount of caffeine you consume. Once you quit smoking, the amount of caffeine in your body increases and can give you symptoms, such as a rapid heartbeat, sweating, and anxiety.  Avoid smokers because they can make you want to smoke.  Do not let weight gain distract you. Many smokers will gain weight when they quit, usually less than 10 pounds. Eat a healthy diet and stay active. You can always lose the weight gained after you quit.  Find ways to improve your mood other than smoking. FOR MORE INFORMATION  www.smokefree.gov  Document Released: 05/03/2001 Document Revised: 09/23/2013 Document Reviewed: 08/18/2011 [facility] Patient Information 2015 [employer]  [employer]. This information is not intended to replace advice given to you by your health care provider. Make sure you discuss any questions you have with your health care provider.

## 2015-05-07 ENCOUNTER — Encounter: Payer: Self-pay | Admitting: Internal Medicine

## 2015-05-07 ENCOUNTER — Ambulatory Visit (INDEPENDENT_AMBULATORY_CARE_PROVIDER_SITE_OTHER): Payer: 59 | Admitting: Internal Medicine

## 2015-05-07 VITALS — BP 104/60 | HR 71 | Ht 69.0 in | Wt 142.0 lb

## 2015-05-07 DIAGNOSIS — R0600 Dyspnea, unspecified: Secondary | ICD-10-CM | POA: Diagnosis not present

## 2015-05-07 DIAGNOSIS — J449 Chronic obstructive pulmonary disease, unspecified: Secondary | ICD-10-CM | POA: Diagnosis not present

## 2015-05-07 DIAGNOSIS — J45909 Unspecified asthma, uncomplicated: Secondary | ICD-10-CM

## 2015-05-07 LAB — NITRIC OXIDE: NITRIC OXIDE: 11

## 2015-05-07 MED ORDER — FAMOTIDINE 20 MG PO TABS: ORAL_TABLET | ORAL | Status: DC

## 2015-05-07 MED ORDER — BUDESONIDE-FORMOTEROL FUMARATE 160-4.5 MCG/ACT IN AERO: 2.0000 | INHALATION_SPRAY | Freq: Two times a day (BID) | RESPIRATORY_TRACT | Status: DC

## 2015-05-07 MED ORDER — PANTOPRAZOLE SODIUM 40 MG PO TBEC: 40.0000 mg | DELAYED_RELEASE_TABLET | Freq: Every day | ORAL | Status: DC

## 2015-05-07 MED ORDER — ALBUTEROL SULFATE HFA 108 (90 BASE) MCG/ACT IN AERS: 2.0000 | INHALATION_SPRAY | RESPIRATORY_TRACT | Status: AC | PRN
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
## 2015-05-07 NOTE — Assessment & Plan Note (Signed)
05/07/2015  Walked RA x 3 laps @ 185 ft each stopped due to End of study, nl pace, no sob or desat  / would not walk at any more than a leisurely pace  - spirometry 05/07/2015  Non physiologic f/v with at least moderate airflow obst  - NO 05/07/2015  = 11  - 05/07/2015  extensive coaching HFA effectiveness =    75%   Symptoms are markedly disproportionate to objective findings and not clear this is all a lung problem but pt does appear to have difficult airway management issues. DDX of  difficult airways management all start with A and  include Adherence, Ace Inhibitors, Acid Reflux, Active Sinus Disease, Alpha 1 Antitripsin deficiency, Anxiety masquerading as Airways dz,  ABPA,  allergy(esp in young), Aspiration (esp in elderly), Adverse effects of meds,  Active smokers, A bunch of PE's (a small clot burden can't cause this syndrome unless there is already severe underlying pulm or vascular dz with poor reserve) plus two Bs  = Bronchiectasis and Beta blocker use..and one C= CHF  Adherence is always the initial "prime suspect" and is a multilayered concern that requires a "trust but verify" approach in every patient - starting with knowing how to use medications, especially inhalers, correctly, keeping up with refills and understanding the fundamental difference between maintenance and prns vs those medications only taken for a very short course and then stopped and not refilled.  - The proper method of use, as well as anticipated side effects, of a metered-dose inhaler are discussed and demonstrated to the patient. Improved effectiveness after extensive coaching during this visit to a level of approximately 75 % from a baseline of 50 %   ? Acid (or non-acid) GERD > always difficult to exclude as up to 75% of pts in some series report no assoc GI/ Heartburn symptoms and 100% of pts with asthma and HB have clear cut GERD which applies here > rec max (24h)  acid suppression and diet restrictions/ reviewed  and instructions given in writing.   ? Active smoking > says Pos HC was due to po THC, not smoking pot or cigs at this point  ? Allergy/ allergic asthma> very unlikely with such a low NO  ? Anxiety driving excess saba use >  Discouraged using the "spare tire" concept   I had an extended discussion with the patient reviewing all relevant studies completed to date and  lasting  35 minutes of a 60 minute visit    Each maintenance medication was reviewed in detail including most importantly the difference between maintenance and prns and under what circumstances the prns are to be triggered using an action plan format that is not reflected in the computer generated alphabetically organized AVS.    Please see instructions for details which were reviewed in writing and the patient given a copy highlighting the part that I personally wrote and discussed at today's ov.

## 2015-05-07 NOTE — Assessment & Plan Note (Signed)
At this point difficult to know full reversibility of the airflow obstruction but will leave just on the symbicort 160 2bid until sort it all out with full pfts to follow p fully "tuned up"

## 2015-05-07 NOTE — Patient Instructions (Addendum)
Pantoprazole (protonix) 40 mg   Take  30-60 min before first meal of the day and Pepcid (famotidine)  20 mg one @  bedtime until return to office - this is the best way to tell whether stomach acid is contributing to your problem.    GERD (REFLUX)  is an extremely common cause of respiratory symptoms just like yours , many times with no obvious heartburn at all.    It can be treated with medication, but also with lifestyle changes including elevation of the head of your bed (ideally with 6 inch  bed blocks),  Smoking cessation, avoidance of late meals, excessive alcohol, and avoid fatty foods, chocolate, peppermint, colas, red wine, and acidic juices such as orange juice.  NO MINT OR MENTHOL PRODUCTS SO NO COUGH DROPS  USE SUGARLESS CANDY INSTEAD (Jolley ranchers or Stover's or Life Savers) or even ice chips will also do - the key is to swallow to prevent all throat clearing. NO OIL BASED VITAMINS - use powdered substitutes.  Continue symbicort 160 Take 2 puffs first thing in am and then another 2 puffs about 12 hours later.   Only use your albuterol as a rescue medication to be used if you can't catch your breath by resting or doing a relaxed purse lip breathing pattern.  - The less you use it, the better it will work when you need it. - Ok to use up to 2 puffs  every 4 hours if you must but call for immediate appointment if use goes up over your usual need - Don't leave home without it !!  (think of it like the spare tire for your car)   Please schedule a follow up office visit in 6  weeks, sooner if needed with full pfts (no inhalers that am unless severe symptoms)

## 2015-06-23 ENCOUNTER — Ambulatory Visit: Payer: 59 | Admitting: Internal Medicine

## 2016-02-11 ENCOUNTER — Other Ambulatory Visit: Payer: Self-pay | Admitting: Internal Medicine

## 2016-06-02 ENCOUNTER — Ambulatory Visit
Admission: RE | Admit: 2016-06-02 | Discharge: 2016-06-02 | Disposition: A | Discharge status: Home / Self Care | Payer: BLUE CROSS/BLUE SHIELD | Source: Ambulatory Visit | Attending: Family Medicine | Admitting: Family Medicine

## 2016-06-02 ENCOUNTER — Other Ambulatory Visit: Payer: Self-pay | Admitting: Family Medicine

## 2016-06-02 DIAGNOSIS — M25512 Pain in left shoulder: Secondary | ICD-10-CM

## 2016-09-14 ENCOUNTER — Encounter (HOSPITAL_COMMUNITY): Payer: Self-pay | Admitting: Emergency Medicine

## 2016-09-14 ENCOUNTER — Emergency Department (HOSPITAL_COMMUNITY): Payer: BLUE CROSS/BLUE SHIELD

## 2016-09-14 ENCOUNTER — Inpatient Hospital Stay (HOSPITAL_COMMUNITY)
Admission: EM | Admit: 2016-09-14 | Discharge: 2016-09-15 | DRG: 189 | Discharge status: Against Medical Advice | Payer: BLUE CROSS/BLUE SHIELD | Attending: Family Medicine | Admitting: Family Medicine

## 2016-09-14 DIAGNOSIS — R0603 Acute respiratory distress: Secondary | ICD-10-CM

## 2016-09-14 DIAGNOSIS — J9601 Acute respiratory failure with hypoxia: Principal | ICD-10-CM | POA: Diagnosis present

## 2016-09-14 DIAGNOSIS — F1210 Cannabis abuse, uncomplicated: Secondary | ICD-10-CM | POA: Diagnosis present

## 2016-09-14 DIAGNOSIS — R000 Tachycardia, unspecified: Secondary | ICD-10-CM

## 2016-09-14 DIAGNOSIS — N182 Chronic kidney disease, stage 2 (mild): Secondary | ICD-10-CM | POA: Diagnosis present

## 2016-09-14 DIAGNOSIS — Z87891 Personal history of nicotine dependence: Secondary | ICD-10-CM | POA: Diagnosis not present

## 2016-09-14 DIAGNOSIS — J449 Chronic obstructive pulmonary disease, unspecified: Secondary | ICD-10-CM | POA: Diagnosis present

## 2016-09-14 DIAGNOSIS — Z79899 Other long term (current) drug therapy: Secondary | ICD-10-CM | POA: Diagnosis not present

## 2016-09-14 DIAGNOSIS — J4541 Moderate persistent asthma with (acute) exacerbation: Secondary | ICD-10-CM | POA: Diagnosis present

## 2016-09-14 DIAGNOSIS — J40 Bronchitis, not specified as acute or chronic: Secondary | ICD-10-CM | POA: Diagnosis present

## 2016-09-14 DIAGNOSIS — R0902 Hypoxemia: Secondary | ICD-10-CM | POA: Diagnosis not present

## 2016-09-14 DIAGNOSIS — Z7289 Other problems related to lifestyle: Secondary | ICD-10-CM

## 2016-09-14 DIAGNOSIS — F1910 Other psychoactive substance abuse, uncomplicated: Secondary | ICD-10-CM | POA: Diagnosis not present

## 2016-09-14 DIAGNOSIS — J45901 Unspecified asthma with (acute) exacerbation: Secondary | ICD-10-CM | POA: Diagnosis present

## 2016-09-14 DIAGNOSIS — Z7951 Long term (current) use of inhaled steroids: Secondary | ICD-10-CM

## 2016-09-14 DIAGNOSIS — J4521 Mild intermittent asthma with (acute) exacerbation: Secondary | ICD-10-CM | POA: Diagnosis not present

## 2016-09-14 HISTORY — DX: Other psychoactive substance abuse, uncomplicated: F19.10

## 2016-09-14 LAB — CREATININE, SERUM
CREATININE: 1.28 mg/dL — AB (ref 0.61–1.24)
GFR calc Af Amer: >60 mL/min (ref >60)

## 2016-09-14 LAB — CBC
HCT: 46.4 % (ref 39.0–52.0)
HEMOGLOBIN: 16 g/dL (ref 13.0–17.0)
MCH: 29.4 pg (ref 26.0–34.0)
MCHC: 34.5 g/dL (ref 30.0–36.0)
MCV: 85.3 fL (ref 78.0–100.0)
Platelets: 132 10*3/uL — ABNORMAL LOW (ref 150–400)
RBC: 5.44 MIL/uL (ref 4.22–5.81)
RDW: 13.9 % (ref 11.5–15.5)
WBC: 13.4 10*3/uL — ABNORMAL HIGH (ref 4.0–10.5)

## 2016-09-14 LAB — BASIC METABOLIC PANEL
ANION GAP: 10 (ref 5–15)
BUN: 10 mg/dL (ref 6–20)
CALCIUM: 9.6 mg/dL (ref 8.9–10.3)
CO2: 23 mmol/L (ref 22–32)
Chloride: 106 mmol/L (ref 101–111)
Creatinine, Ser: 1.06 mg/dL (ref 0.61–1.24)
GFR calc Af Amer: >60 mL/min (ref >60)
GFR calc non Af Amer: >60 mL/min (ref >60)
GLUCOSE: 97 mg/dL (ref 65–99)
POTASSIUM: 3.6 mmol/L (ref 3.5–5.1)
Sodium: 139 mmol/L (ref 135–145)

## 2016-09-14 LAB — CBC WITH DIFFERENTIAL/PLATELET
Basophils Absolute: 0 10*3/uL (ref 0.0–0.1)
Basophils Relative: 0 %
Eosinophils Absolute: 0.3 10*3/uL (ref 0.0–0.7)
Eosinophils Relative: 3 %
HEMATOCRIT: 49.1 % (ref 39.0–52.0)
Hemoglobin: 17.2 g/dL — ABNORMAL HIGH (ref 13.0–17.0)
LYMPHS PCT: 11 %
Lymphs Abs: 0.9 10*3/uL (ref 0.7–4.0)
MCH: 29.9 pg (ref 26.0–34.0)
MCHC: 35 g/dL (ref 30.0–36.0)
MCV: 85.2 fL (ref 78.0–100.0)
MONO ABS: 0.4 10*3/uL (ref 0.1–1.0)
Monocytes Relative: 5 %
NEUTROS ABS: 7 10*3/uL (ref 1.7–7.7)
Neutrophils Relative %: 81 %
Platelets: 137 10*3/uL — ABNORMAL LOW (ref 150–400)
RBC: 5.76 MIL/uL (ref 4.22–5.81)
RDW: 13.9 % (ref 11.5–15.5)
WBC: 8.6 10*3/uL (ref 4.0–10.5)

## 2016-09-14 LAB — MAGNESIUM: Magnesium: 2.2 mg/dL (ref 1.7–2.4)

## 2016-09-14 MED ORDER — POTASSIUM CHLORIDE IN NACL 20-0.9 MEQ/L-% IV SOLN
INTRAVENOUS | Status: AC
  Administered 2016-09-14: 16:00:00 via INTRAVENOUS
  Filled 2016-09-14: qty 1000

## 2016-09-14 MED ORDER — LORAZEPAM 1 MG PO TABS
1.0000 mg | ORAL_TABLET | Freq: Four times a day (QID) | ORAL | Status: DC | PRN
  Administered 2016-09-15: 1 mg via ORAL
  Filled 2016-09-14: qty 1

## 2016-09-14 MED ORDER — LORAZEPAM 2 MG/ML IJ SOLN
1.0000 mg | Freq: Four times a day (QID) | INTRAMUSCULAR | Status: DC | PRN
  Administered 2016-09-14: 1 mg via INTRAVENOUS
  Filled 2016-09-14: qty 1

## 2016-09-14 MED ORDER — METHYLPREDNISOLONE SODIUM SUCC 125 MG IJ SOLR
80.0000 mg | Freq: Three times a day (TID) | INTRAMUSCULAR | Status: DC
  Administered 2016-09-14 – 2016-09-15 (×3): 80 mg via INTRAVENOUS
  Filled 2016-09-14 (×3): qty 2

## 2016-09-14 MED ORDER — FOLIC ACID 1 MG PO TABS
1.0000 mg | ORAL_TABLET | Freq: Every day | ORAL | Status: DC
  Administered 2016-09-14 – 2016-09-15 (×2): 1 mg via ORAL
  Filled 2016-09-14 (×2): qty 1

## 2016-09-14 MED ORDER — TRAZODONE HCL 50 MG PO TABS: 25.0000 mg | ORAL_TABLET | Freq: Every evening | ORAL | Status: DC | PRN

## 2016-09-14 MED ORDER — AZITHROMYCIN 250 MG PO TABS
500.0000 mg | ORAL_TABLET | Freq: Every day | ORAL | Status: AC
  Administered 2016-09-14: 500 mg via ORAL
  Filled 2016-09-14: qty 2

## 2016-09-14 MED ORDER — ACETAMINOPHEN 325 MG PO TABS
650.0000 mg | ORAL_TABLET | Freq: Four times a day (QID) | ORAL | Status: DC | PRN
  Administered 2016-09-15: 650 mg via ORAL
  Filled 2016-09-14: qty 2

## 2016-09-14 MED ORDER — THIAMINE HCL 100 MG/ML IJ SOLN
100.0000 mg | Freq: Every day | INTRAMUSCULAR | Status: DC
  Administered 2016-09-15: 100 mg via INTRAVENOUS
  Filled 2016-09-14: qty 2

## 2016-09-14 MED ORDER — METOPROLOL TARTRATE 5 MG/5ML IV SOLN
5.0000 mg | Freq: Once | INTRAVENOUS | Status: AC
  Administered 2016-09-14: 5 mg via INTRAVENOUS
  Filled 2016-09-14: qty 5

## 2016-09-14 MED ORDER — LORAZEPAM 2 MG/ML IJ SOLN: 0.0000 mg | Freq: Two times a day (BID) | INTRAMUSCULAR | Status: DC

## 2016-09-14 MED ORDER — ALBUTEROL (5 MG/ML) CONTINUOUS INHALATION SOLN
10.0000 mg/h | INHALATION_SOLUTION | RESPIRATORY_TRACT | Status: DC
  Administered 2016-09-14: 10 mg/h via RESPIRATORY_TRACT
  Filled 2016-09-14: qty 20

## 2016-09-14 MED ORDER — IPRATROPIUM BROMIDE 0.02 % IN SOLN
0.5000 mg | Freq: Four times a day (QID) | RESPIRATORY_TRACT | Status: DC
  Administered 2016-09-14 – 2016-09-15 (×5): 0.5 mg via RESPIRATORY_TRACT
  Filled 2016-09-14 (×6): qty 2.5

## 2016-09-14 MED ORDER — AZITHROMYCIN 500 MG PO TABS
250.0000 mg | ORAL_TABLET | Freq: Every day | ORAL | Status: DC
  Administered 2016-09-15: 250 mg via ORAL
  Filled 2016-09-14: qty 1

## 2016-09-14 MED ORDER — LORAZEPAM 2 MG/ML IJ SOLN: 0.0000 mg | Freq: Four times a day (QID) | INTRAMUSCULAR | Status: DC

## 2016-09-14 MED ORDER — SODIUM CHLORIDE 0.9% FLUSH
3.0000 mL | Freq: Two times a day (BID) | INTRAVENOUS | Status: DC
  Administered 2016-09-14 – 2016-09-15 (×2): 3 mL via INTRAVENOUS

## 2016-09-14 MED ORDER — ENOXAPARIN SODIUM 40 MG/0.4ML ~~LOC~~ SOLN
40.0000 mg | SUBCUTANEOUS | Status: DC
  Administered 2016-09-14: 40 mg via SUBCUTANEOUS
  Filled 2016-09-14: qty 0.4

## 2016-09-14 MED ORDER — MAGNESIUM SULFATE 2 GM/50ML IV SOLN
2.0000 g | Freq: Once | INTRAVENOUS | Status: AC
  Administered 2016-09-14: 2 g via INTRAVENOUS
  Filled 2016-09-14: qty 50

## 2016-09-14 MED ORDER — ORAL CARE MOUTH RINSE
15.0000 mL | Freq: Two times a day (BID) | OROMUCOSAL | Status: DC
  Administered 2016-09-14 – 2016-09-15 (×2): 15 mL via OROMUCOSAL

## 2016-09-14 MED ORDER — ONDANSETRON HCL 4 MG/2ML IJ SOLN: 4.0000 mg | Freq: Four times a day (QID) | INTRAMUSCULAR | Status: DC | PRN

## 2016-09-14 MED ORDER — METHYLPREDNISOLONE SODIUM SUCC 125 MG IJ SOLR
125.0000 mg | Freq: Once | INTRAMUSCULAR | Status: AC
  Administered 2016-09-14: 125 mg via INTRAVENOUS
  Filled 2016-09-14: qty 2

## 2016-09-14 MED ORDER — ONDANSETRON HCL 4 MG PO TABS: 4.0000 mg | ORAL_TABLET | Freq: Four times a day (QID) | ORAL | Status: DC | PRN

## 2016-09-14 MED ORDER — VITAMIN B-1 100 MG PO TABS
100.0000 mg | ORAL_TABLET | Freq: Every day | ORAL | Status: DC
  Administered 2016-09-14: 100 mg via ORAL
  Filled 2016-09-14: qty 1

## 2016-09-14 MED ORDER — ADULT MULTIVITAMIN W/MINERALS CH
1.0000 | ORAL_TABLET | Freq: Every day | ORAL | Status: DC
  Administered 2016-09-14 – 2016-09-15 (×2): 1 via ORAL
  Filled 2016-09-14 (×2): qty 1

## 2016-09-14 MED ORDER — POLYETHYLENE GLYCOL 3350 17 G PO PACK: 17.0000 g | PACK | Freq: Every day | ORAL | Status: DC | PRN

## 2016-09-14 MED ORDER — ALBUTEROL SULFATE (2.5 MG/3ML) 0.083% IN NEBU
2.5000 mg | INHALATION_SOLUTION | RESPIRATORY_TRACT | Status: DC | PRN
  Administered 2016-09-14: 2.5 mg via RESPIRATORY_TRACT
  Filled 2016-09-14: qty 3

## 2016-09-14 MED ORDER — ACETAMINOPHEN 650 MG RE SUPP: 650.0000 mg | Freq: Four times a day (QID) | RECTAL | Status: DC | PRN

## 2016-09-14 MED ORDER — LEVALBUTEROL HCL 0.63 MG/3ML IN NEBU
0.6300 mg | INHALATION_SOLUTION | Freq: Four times a day (QID) | RESPIRATORY_TRACT | Status: DC
  Administered 2016-09-14 – 2016-09-15 (×5): 0.63 mg via RESPIRATORY_TRACT
  Filled 2016-09-14 (×6): qty 3

## 2016-09-14 MED ORDER — ALBUTEROL (5 MG/ML) CONTINUOUS INHALATION SOLN
10.0000 mg/h | INHALATION_SOLUTION | RESPIRATORY_TRACT | Status: DC
  Filled 2016-09-14: qty 20

## 2016-09-14 MED ORDER — GUAIFENESIN ER 600 MG PO TB12
1200.0000 mg | ORAL_TABLET | Freq: Two times a day (BID) | ORAL | Status: DC
  Administered 2016-09-14 – 2016-09-15 (×2): 1200 mg via ORAL
  Filled 2016-09-14 (×2): qty 2

## 2016-09-14 NOTE — Progress Notes (Signed)
Dr. Joanne Chars returned page.  Pt to go to stepdown.  Dr. Konrad Dolores increase O2 to 4 lpm via [place].
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
## 2016-09-14 NOTE — Progress Notes (Signed)
Patient is being transferred to  4E18. Alert and oriented x 4. Denies pain. No respiratory distress noted.

## 2016-09-14 NOTE — Progress Notes (Signed)
Patient admitted as transfer from floor 6E. Patient arrived on 4L O2 via [place] with expiratory wheeze in all fields. RT at bedside soon after admission to administer scheduled neb treatments. Patient able to tolerate down titration of O2 to 2.5L [place] at this time with no adverse effects or desaturations. Will continue to monitor patient closely.

## 2016-09-14 NOTE — Progress Notes (Signed)
Pt took home inhaler due to pain upon breathing in.

## 2016-09-14 NOTE — ED Notes (Signed)
Patient is resting on stretcher states he is feeling better

## 2016-09-14 NOTE — ED Notes (Signed)
Resting on stretcher feeling better

## 2016-09-14 NOTE — ED Triage Notes (Signed)
Pt here for worsening asthma sx today; pt labored at present

## 2016-09-14 NOTE — ED Notes (Signed)
Patient presents to ed c/o sob states he started having problems breathing last pm used his inhaler without relief. Upon arrival audible wheezing heard bilaterally. Patient is able to speak in short sentences.

## 2016-09-14 NOTE — Progress Notes (Signed)
O2 sat 95% on 2lpm via [place].  Patient called out having rapid response. Paged RT.  Pt having retractions, sitting on bedside shaking. Able to speak sentences.

## 2016-09-14 NOTE — ED Notes (Signed)
Breathing treatment currently going patient appears much more comfortable. States he is feeling better.

## 2016-09-14 NOTE — Progress Notes (Signed)
Paged Dr. Konrad Dolores advised pts condition, no breathing treatmeents ordered.

## 2016-09-14 NOTE — Progress Notes (Signed)
Paged Dr. Konrad Dolores pt on floor.  Pt alert and oriented X 4.  VSS. Denies pain and shortness of breath.
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
## 2016-09-14 NOTE — Significant Event (Signed)
Rapid Response Event Note RN called for increase WOB  Overview: Time Called: 1718 Arrival Time: 1725 Event Type: Respiratory  Initial Focused Assessment: On arrival pt sitting up right in bed, skin warm and dry, a/o x4, tachypneic, lungs clear through out bilaterally, per [facility] RT and Inetta Fermo RN pt was wheezing with crackles bilaterally. Pt and family report pt had coughed up a large about of yellow sputum just prior to my arrival. RN reassessed and stated he sounded a lot better and clear. 96% 4L [place] RR 26  Interventions: PRN dose of albuterol 2.5 mg given. Pt is to be transferred to SDU.    Event Summary: Name of Physician Notified: Dr. Konrad Dolores  at 1730    at    Outcome: Transferred (Comment) (SDU)     Phillips Grout, Sandi Carne

## 2016-09-14 NOTE — Progress Notes (Signed)
Paged Dr. Konrad Dolores RT states will have to go to stepdown for CAT, does he want to transfer or change RT order.

## 2016-09-15 DIAGNOSIS — J4521 Mild intermittent asthma with (acute) exacerbation: Secondary | ICD-10-CM

## 2016-09-15 LAB — CBC
HCT: 44.7 % (ref 39.0–52.0)
Hemoglobin: 15.2 g/dL (ref 13.0–17.0)
MCH: 28.9 pg (ref 26.0–34.0)
MCHC: 34 g/dL (ref 30.0–36.0)
MCV: 85 fL (ref 78.0–100.0)
PLATELETS: 142 10*3/uL — AB (ref 150–400)
RBC: 5.26 MIL/uL (ref 4.22–5.81)
RDW: 14.2 % (ref 11.5–15.5)
WBC: 17.7 10*3/uL — ABNORMAL HIGH (ref 4.0–10.5)

## 2016-09-15 LAB — MRSA PCR SCREENING: MRSA by PCR: NEGATIVE

## 2016-09-15 LAB — COMPREHENSIVE METABOLIC PANEL
ALK PHOS: 49 U/L (ref 38–126)
ALT: 14 U/L — AB (ref 17–63)
AST: 18 U/L (ref 15–41)
Albumin: 4.1 g/dL (ref 3.5–5.0)
Anion gap: 7 (ref 5–15)
BUN: 10 mg/dL (ref 6–20)
CALCIUM: 9.1 mg/dL (ref 8.9–10.3)
CO2: 22 mmol/L (ref 22–32)
CREATININE: 1.07 mg/dL (ref 0.61–1.24)
Chloride: 108 mmol/L (ref 101–111)
Glucose, Bld: 140 mg/dL — ABNORMAL HIGH (ref 65–99)
Potassium: 3.9 mmol/L (ref 3.5–5.1)
SODIUM: 137 mmol/L (ref 135–145)
Total Bilirubin: 0.3 mg/dL (ref 0.3–1.2)
Total Protein: 7 g/dL (ref 6.5–8.1)

## 2016-09-15 LAB — RAPID URINE DRUG SCREEN, HOSP PERFORMED
AMPHETAMINES: NOT DETECTED
BENZODIAZEPINES: NOT DETECTED
Barbiturates: NOT DETECTED
Cocaine: NOT DETECTED
OPIATES: NOT DETECTED
Tetrahydrocannabinol: POSITIVE — AB

## 2016-09-15 LAB — HIV ANTIBODY (ROUTINE TESTING W REFLEX): HIV Screen 4th Generation wRfx: NONREACTIVE

## 2016-09-15 NOTE — Progress Notes (Signed)
Pt says he wants to go home - explained AMA to pt says he still wants to go home - Asked me to call RD Samtani and ask for an RX for prednisone.

## 2016-09-15 NOTE — Care Management Note (Signed)
Case Management Note  Patient Details  Name: JQUAN EGELSTON MRN: [id number] Date of Birth: [birth year]  Subjective/Objective:      Presents with  asthma exacerbation and bronchitis.              Action/Plan: NCM will follow for dc needs.  Expected Discharge Date:                  Expected Discharge Plan:  Home/Self Care  In-House Referral:     Discharge planning Services  CM Consult  Post Acute Care Choice:    Choice offered to:     DME Arranged:    DME Agency:     HH Arranged:    HH Agency:     Status of Service:  In process, will continue to follow  If discussed at Long Length of Stay Meetings, dates discussed:    Additional Comments:  Leone Haven, RN 09/15/2016, 4:13 PM

## 2016-09-15 NOTE — Progress Notes (Signed)
Call made to Dr Mahala Menghini - for pt. Getting ready to transfer pt upstairs and pt says he now wants to go home. Pt on IV solumedrol MD says he will d/c him probably tomorrow
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
## 2016-09-15 NOTE — Progress Notes (Signed)
Pt left AMA after signing paper - has cell phone and charger & all personal belongings with him dressed and ready to go has a friend picking him up at the ED door

## 2016-09-15 NOTE — Progress Notes (Addendum)
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 Chest x-ray showed no acute infiltrate Patient oxygen sats 93-100 on 2 L white count normal EKG showed sinus tachycardia patient given Solu-Medrol and nebulization in emergency room     Assessment & Plan:   Principal Problem:   Asthma exacerbation Active Problems:   Polysubstance abuse   Chronic obstructive airway disease with asthma (HCC)   Acute respiratory failure with hypoxia (HCC)   Signs and symptoms of severe respiratory distress   Tachycardia   Asthma exacerbation? COPD Continue  prn nebs. Continue azithromycin for potential COPD flare. Transition off Solu-Medrol 80 3 times a day to prednisone 40 Continue Atrovent 2.5 every 6's as well as Xopenex 0.6 every 6  Polysubstance abuse Urged to quit smoking CIWA negative currently  Tachy 2/2 to albuterol and ambulation Ok for [employer] Inpatient--transfer to med surg Full code Home 24-48 h  Consultants:   none  Procedures:   none  Antimicrobials:   Azithro    Subjective:  Feels much better no new issue Wheeze improved No cp Doesn't drink much  Objective: Vitals:   09/14/16 2101 09/14/16 2340 09/15/16 0431 09/15/16 0700  BP:  117/83 134/86 138/68  Pulse: (!) 104 (!) 109 94   Resp: (!) 22 (!) 24 (!) 24 (!) 24  Temp:  99 F (37.2 C) 98.4 F (36.9 C) 98.6 F (37 C)  TempSrc:  Oral Oral Oral  SpO2: 100% 95% 100%   Weight:      Height:        Intake/Output Summary (Last 24 hours) at 09/15/16 0748 Last data  filed at 09/14/16 2348  Gross per 24 hour  Intake          1086.67 ml  Output              325 ml  Net           761.67 ml   Filed Weights   09/14/16 1538 09/14/16 2047  Weight: 63.3 kg (139 lb 8.8 oz) 63.6 kg (140 lb 3.4 oz)    Examination:  General exam: Appears calm and comfortable  Respiratory system: Clear to auscultation. Respiratory effort normal. No wheeze nor rales Cardiovascular system: S1 & S2 heard, RRR. No JVD, murmurs, rubs, gallops or clicks.  Gastrointestinal system: Abdomen is nondistended, soft and nontender. No organomegaly or masses felt. Normal bowel sounds heard. Central nervous system: Alert and oriented. No focal neurological deficits. Extremities: Symmetric 5 x 5 power. Skin: No rashes, lesions or ulcers Psychiatry: Judgement and insight appear normal. Mood & affect normal   Data Reviewed: I have personally reviewed following labs and imaging studies  CBC:  Recent Labs Lab 09/14/16 0943 09/14/16 1457 09/15/16 0304  WBC 8.6 13.4* 17.7*  NEUTROABS 7.0  --   --   HGB 17.2* 16.0 15.2  HCT 49.1 46.4 44.7  MCV 85.2 85.3 85.0  PLT 137* 132* 142*   Basic Metabolic Panel:  Recent Labs Lab 09/14/16 0943 09/14/16 1457 09/15/16 0304  NA 139  --  137  K 3.6  --  3.9  CL 106  --  108  CO2 23  --  22  GLUCOSE 97  --  140*  BUN 10  --  10  CREATININE 1.06 1.28* 1.07  CALCIUM 9.6  --  9.1  MG  --  2.2  --    GFR: Estimated Creatinine Clearance: 89.2 mL/min (by C-G formula based on SCr of 1.07 mg/dL). Liver Function Tests:  Recent Labs Lab 09/15/16 0304  AST 18  ALT 14*  ALKPHOS 49  BILITOT 0.3  PROT 7.0  ALBUMIN 4.1   No results for input(s): LIPASE, AMYLASE in the last 168 hours. No results for input(s): AMMONIA in the last 168 hours. Coagulation Profile: No results for input(s): INR, PROTIME in the last 168 hours. Cardiac Enzymes: No results for input(s): CKTOTAL, CKMB, CKMBINDEX, TROPONINI in the last 168 hours. BNP (last 3  results) No results for input(s): PROBNP in the last 8760 hours. HbA1C: No results for input(s): HGBA1C in the last 72 hours. CBG: No results for input(s): GLUCAP in the last 168 hours. Lipid Profile: No results for input(s): CHOL, HDL, LDLCALC, TRIG, CHOLHDL, LDLDIRECT in the last 72 hours. Thyroid Function Tests: No results for input(s): TSH, T4TOTAL, FREET4, T3FREE, THYROIDAB in the last 72 hours. Anemia Panel: No results for input(s): VITAMINB12, FOLATE, FERRITIN, TIBC, IRON, RETICCTPCT in the last 72 hours. Urine analysis:    Component Value Date/Time   COLORURINE YELLOW 09/20/2012 0910   APPEARANCEUR CLEAR 09/20/2012 0910   LABSPEC 1.023 09/20/2012 0910   PHURINE 7.5 09/20/2012 0910   GLUCOSEU NEGATIVE 09/20/2012 0910   HGBUR NEGATIVE 09/20/2012 0910   BILIRUBINUR NEGATIVE 09/20/2012 0910   KETONESUR NEGATIVE 09/20/2012 0910   PROTEINUR NEGATIVE 09/20/2012 0910   UROBILINOGEN 0.2 09/20/2012 0910   NITRITE NEGATIVE 09/20/2012 0910   LEUKOCYTESUR SMALL (A) 09/20/2012 0910   Sepsis Labs: (procalcitonin:4,lacticidven:4)  ) Recent Results (from the past 240 hour(s))  MRSA PCR Screening     Status: None   Collection Time: 09/14/16  8:42 PM  Result Value Ref Range Status   MRSA by PCR NEGATIVE NEGATIVE Final    Comment:        The GeneXpert MRSA Assay (FDA approved for NASAL specimens only), is one component of a comprehensive MRSA colonization surveillance program. It is not intended to diagnose MRSA infection nor to guide or monitor treatment for MRSA infections.          Radiology Studies: Dg Chest Port 1 View  Result Date: 09/14/2016 CLINICAL DATA:  Cough and wheezing EXAM: PORTABLE CHEST 1 VIEW COMPARISON:  February 01, 2015 FINDINGS: Lungs are clear. Heart size and pulmonary vascularity are normal. No adenopathy. No bone lesions. IMPRESSION: No edema or consolidation. Electronically Signed   By: Bretta Bang III M.D.   On: 09/14/2016 10:18         Scheduled Meds: . azithromycin  250 mg Oral Daily  . enoxaparin (LOVENOX) injection  40 mg Subcutaneous Q24H  . folic acid  1 mg Oral Daily  . guaiFENesin  1,200 mg Oral BID  . ipratropium  0.5 mg Nebulization Q6H  . levalbuterol  0.63 mg Nebulization Q6H  . LORazepam  0-4 mg Intravenous Q6H   Followed by  . [START ON 09/16/2016] LORazepam  0-4 mg Intravenous Q12H  . mouth rinse  15 mL Mouth Rinse BID  . methylPREDNISolone (SOLU-MEDROL) injection  80 mg Intravenous TID  . multivitamin with minerals  1 tablet Oral Daily  . sodium chloride flush  3  mL Intravenous Q12H  . thiamine  100 mg Oral Daily   Or  . thiamine  100 mg Intravenous Daily   Continuous Infusions: . albuterol       LOS: 1 day    Time spent: 58    Pleas Koch, MD [facility] ([phone]   If 7PM-7AM, please contact night-coverage [email or web address] Password [email or web address] 09/15/2016, 7:48 AM

## 2016-09-19 NOTE — Discharge Summary (Signed)
Physician Discharge Summary  [name] HQI:[id number] DOB: [birth year] DOA: 09/14/2016  PCP: Verlon Au, MD  Admit date: 09/14/2016 Discharge date: 09/19/2016  Time spent: 5 minutes  Recommendations for Outpatient Follow-up:  1. Left AMA-understands risks of leaving against medical advice and assumes risk  Discharge Diagnoses:  Principal Problem:   Asthma exacerbation Active Problems:   Polysubstance abuse   Chronic obstructive airway disease with asthma (HCC)   Acute respiratory failure with hypoxia (HCC)   Signs and symptoms of severe respiratory distress   Tachycardia   Discharge Condition: gaurded  Diet recommendation:  hh  Filed Weights   09/14/16 1538 09/14/16 2047  Weight: 63.3 kg (139 lb 8.8 oz) 63.6 kg (140 lb 3.4 oz)    History of present illness:  See progress note Patient admitted for ashtma exacerb was ion Iv steroids, deicded to leave No Rx given to reutrn to Ed if worsens   Discharge Exam: Vitals:   09/15/16 1600 09/15/16 1631  BP:  138/79  Pulse: (!) 108 (!) 113  Resp: (!) 23 (!) 27  Temp:  99.2 F (37.3 C)    Not examined  Discharge Instructions    Discharge Medication List as of 09/15/2016  5:54 PM    CONTINUE these medications which have NOT CHANGED   Details  albuterol (PROAIR HFA) 108 (90 BASE) MCG/ACT inhaler Inhale 2 puffs into the lungs every 4 (four) hours as needed for wheezing or shortness of breath., Starting Thu 05/07/2015, Normal    ipratropium-albuterol (DUONEB) 0.5-2.5 (3) MG/3ML SOLN Take 3 mLs by nebulization every 6 (six) hours., Starting Tue 02/03/2015, Normal       No Known Allergies    The results of significant diagnostics from this hospitalization (including imaging, microbiology, ancillary and laboratory) are listed below for reference.    Significant Diagnostic Studies: Dg Chest Port 1 View  Result Date: 09/14/2016 CLINICAL DATA:  Cough and wheezing EXAM: PORTABLE CHEST 1 VIEW COMPARISON:   February 01, 2015 FINDINGS: Lungs are clear. Heart size and pulmonary vascularity are normal. No adenopathy. No bone lesions. IMPRESSION: No edema or consolidation. Electronically Signed   By: Bretta Bang III M.D.   On: 09/14/2016 10:18    Microbiology: Recent Results (from the past 240 hour(s))  MRSA PCR Screening     Status: None   Collection Time: 09/14/16  8:42 PM  Result Value Ref Range Status   MRSA by PCR NEGATIVE NEGATIVE Final    Comment:        The GeneXpert MRSA Assay (FDA approved for NASAL specimens only), is one component of a comprehensive MRSA colonization surveillance program. It is not intended to diagnose MRSA infection nor to guide or monitor treatment for MRSA infections.      Labs: Basic Metabolic Panel:  Recent Labs Lab 09/14/16 0943 09/14/16 1457 09/15/16 0304  NA 139  --  137  K 3.6  --  3.9  CL 106  --  108  CO2 23  --  22  GLUCOSE 97  --  140*  BUN 10  --  10  CREATININE 1.06 1.28* 1.07  CALCIUM 9.6  --  9.1  MG  --  2.2  --    Liver Function Tests:  Recent Labs Lab 09/15/16 0304  AST 18  ALT 14*  ALKPHOS 49  BILITOT 0.3  PROT 7.0  ALBUMIN 4.1   No results for input(s): LIPASE, AMYLASE in the last 168 hours. No results for input(s): AMMONIA in the last  168 hours. CBC:  Recent Labs Lab 09/14/16 0943 09/14/16 1457 09/15/16 0304  WBC 8.6 13.4* 17.7*  NEUTROABS 7.0  --   --   HGB 17.2* 16.0 15.2  HCT 49.1 46.4 44.7  MCV 85.2 85.3 85.0  PLT 137* 132* 142*   Cardiac Enzymes: No results for input(s): CKTOTAL, CKMB, CKMBINDEX, TROPONINI in the last 168 hours. BNP: BNP (last 3 results) No results for input(s): BNP in the last 8760 hours.  ProBNP (last 3 results) No results for input(s): PROBNP in the last 8760 hours.  CBG: No results for input(s): GLUCAP in the last 168 hours.     SignedRhetta Mura MD   [facility] 09/19/2016, 3:14 PM

## 2017-05-30 ENCOUNTER — Other Ambulatory Visit: Payer: Self-pay

## 2017-05-30 ENCOUNTER — Emergency Department (HOSPITAL_COMMUNITY): Payer: Self-pay

## 2017-05-30 ENCOUNTER — Ambulatory Visit (HOSPITAL_COMMUNITY): Admission: RE | Admit: 2017-05-30 | Payer: No Typology Code available for payment source | Source: Ambulatory Visit

## 2017-05-30 ENCOUNTER — Encounter (HOSPITAL_COMMUNITY): Payer: Self-pay | Admitting: Emergency Medicine

## 2017-05-30 ENCOUNTER — Emergency Department (HOSPITAL_COMMUNITY)
Admission: EM | Admit: 2017-05-30 | Discharge: 2017-05-30 | Payer: Self-pay | Attending: Emergency Medicine | Admitting: Emergency Medicine

## 2017-05-30 DIAGNOSIS — S0993XA Unspecified injury of face, initial encounter: Secondary | ICD-10-CM

## 2017-05-30 DIAGNOSIS — S60222A Contusion of left hand, initial encounter: Secondary | ICD-10-CM | POA: Insufficient documentation

## 2017-05-30 DIAGNOSIS — S01111A Laceration without foreign body of right eyelid and periocular area, initial encounter: Secondary | ICD-10-CM | POA: Insufficient documentation

## 2017-05-30 DIAGNOSIS — J45909 Unspecified asthma, uncomplicated: Secondary | ICD-10-CM | POA: Insufficient documentation

## 2017-05-30 DIAGNOSIS — Y929 Unspecified place or not applicable: Secondary | ICD-10-CM | POA: Insufficient documentation

## 2017-05-30 DIAGNOSIS — T07XXXA Unspecified multiple injuries, initial encounter: Secondary | ICD-10-CM

## 2017-05-30 DIAGNOSIS — R0602 Shortness of breath: Secondary | ICD-10-CM | POA: Insufficient documentation

## 2017-05-30 DIAGNOSIS — Y9389 Activity, other specified: Secondary | ICD-10-CM | POA: Insufficient documentation

## 2017-05-30 DIAGNOSIS — Z87891 Personal history of nicotine dependence: Secondary | ICD-10-CM | POA: Insufficient documentation

## 2017-05-30 DIAGNOSIS — Y999 Unspecified external cause status: Secondary | ICD-10-CM | POA: Insufficient documentation

## 2017-05-30 MED ORDER — BACITRACIN ZINC 500 UNIT/GM EX OINT: 1.0000 "application " | TOPICAL_OINTMENT | Freq: Once | CUTANEOUS | Status: DC

## 2017-05-30 NOTE — ED Triage Notes (Signed)
Pt presents with [employer] for evaluation of lacerations to right eyelid and abrasions to left hand from trees. Bleeding controlled at time of triage. 1cm x 0.5cm laceration to eyelid and abrasions to hand.

## 2017-05-30 NOTE — Discharge Instructions (Signed)
Wash the affected area with soap and water and apply a thin layer of topical antibiotic ointment. Do this every 12 hours.  ° °Do not use rubbing alcohol or hydrogen peroxide.                       ° °Look for signs of infection: if you see redness, if the area becomes warm, if pain increases sharply, there is discharge (pus), if red streaks appear or you develop fever or vomiting, RETURN immediately to the Emergency Department  for a recheck.  ° °Please follow with your primary care doctor in the next 2 days for a check-up. They must obtain records for further management.  ° °Do not hesitate to return to the Emergency Department for any new, worsening or concerning symptoms.  ° °

## 2017-05-30 NOTE — ED Notes (Signed)
Bed: WTR5 Expected date:  Expected time:  Means of arrival:  Comments: 

## 2017-05-30 NOTE — ED Provider Notes (Signed)
[name]-EM[facility]PT Provider Note   CSN: [id number] Arrival date & time: 05/30/17  0048     History   Chief Complaint Chief Complaint  Patient presents with  . Laceration    HPI   Blood pressure 132/78, pulse 92, temperature 97.8 F (36.6 C), temperature source Oral, resp. rate 18, height 5\' 9"  (1.753 m), weight 72.6 kg (160 lb), SpO2 96 %.  [name] is a 32 y.o. male complaining of laceration to right eye and left hand sustained an altercation with police.  He states his tetanus shot is up-to-date and given within the last 2 years.  He denies any loss of consciousness, difficulty moving vision, nosebleed, intraoral trauma, cervicalgia, chest pain or abdominal pain.  He denies any wrist pain he states he is left-hand dominant.  He is reporting mild shortness of breath consistent with mild asthma exacerbation.  No fever, chills, cough or wheezing.  Past Medical History:  Diagnosis Date  . Asthma   . Polysubstance abuse [facility])     Patient Active Problem List   Diagnosis Date Noted  . Acute respiratory failure with hypoxia (HCC) 09/14/2016  . Signs and symptoms of severe respiratory distress   . Tachycardia   . Dyspnea 05/07/2015  . 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 Polysubstance abuse (HCC)   . AKI (acute kidney injury) (HCC)   . Asthma attack 02/01/2015  . Asthma exacerbation 02/01/2015  . Hypokalemia 02/01/2015    Past Surgical History:  Procedure Laterality Date  . EYE SURGERY         Home Medications    Prior to Admission medications   Medication Sig Start Date End Date Taking? Authorizing Provider  albuterol (PROAIR HFA) 108 (90 BASE) MCG/ACT inhaler Inhale 2 puffs into the lungs every 4 (four) hours as needed for wheezing or shortness of breath. 05/07/15   Nyoka Cowden, MD  ipratropium-albuterol (DUONEB) 0.5-2.5 (3) MG/3ML SOLN Take 3 mLs by nebulization every 6 (six) hours. 02/03/15    Edsel Petrin, DO    Family History Family History  Problem Relation Age of Onset  . Diabetes Father   . Asthma Father   . Hypertension Father   . Allergies Child   . Asthma Child   . Asthma Child     Social History Social History   Tobacco Use  . Smoking status: Former Smoker    Packs/day: 0.50    Years: 5.00    Pack years: 2.50    Types: Cigarettes    Last attempt to quit: 09/17/2008    Years since quitting: 8.7  . Smokeless tobacco: Never Used  Substance Use Topics  . Alcohol use: Yes    Comment: Vodka - 1/2 pint 2-3 times a week  . Drug use: Yes    Types: Marijuana     Allergies   Patient has no known allergies.   Review of Systems Review of Systems  A complete review of systems was obtained and all systems are negative except as noted in the HPI and PMH.   Physical Exam Updated Vital Signs BP (!) 144/88 (BP Location: Right Arm)   Pulse 68   Temp 97.8 F (36.6 C) (Oral)   Resp 18   Ht 5\' 9"  (1.753 m)   Wt 72.6 kg (160 lb)   SpO2 100%   BMI 23.63 kg/m   Physical Exam  Constitutional: He is oriented to person, place, and time. He appears well-developed and well-nourished. No  distress.  HENT:  Head: Normocephalic.  Mouth/Throat: Oropharynx is clear and moist.  Mild edema over right eyelid.  Partial-thickness abrasion with no subcu fat.  No tenderness to palpation or crepitance along the orbital rim, extraocular movement is intact without pain or diplopia, no tenderness to palpation over the nasal bridge, no intraoral trauma or loose teeth, no hemotympanum  Eyes: Conjunctivae and EOM are normal. Pupils are equal, round, and reactive to light.  Neck: Normal range of motion.  No midline C-spine  tenderness to palpation or step-offs appreciated. Patient has full range of motion without pain.  Grip strength, biceps, triceps 5/5 bilaterally;  can differentiate between pinprick and light touch bilaterally.    Cardiovascular: Normal rate, regular rhythm  and intact distal pulses.  Pulmonary/Chest: Effort normal and breath sounds normal. No stridor. No respiratory distress. He has no wheezes. He has no rales. He exhibits no tenderness.  Abdominal: Soft. He exhibits no distension and no mass. There is no tenderness. There is no rebound and no guarding. No hernia.  Musculoskeletal: Normal range of motion. He exhibits edema.  No snuffbox tenderness to palpation bilaterally  Mild edema to the left hand along the metacarpals, grip strength 5 out of 5.  No focal bony tenderness  Neurological: He is alert and oriented to person, place, and time.  Skin: He is not diaphoretic.  Psychiatric: He has a normal mood and affect.  Nursing note and vitals reviewed.    ED Treatments / Results  Labs (all labs ordered are listed, but only abnormal results are displayed) Labs Reviewed - No data to display  EKG  EKG Interpretation None       Radiology No results found.  Procedures Procedures (including critical care time)  Medications Ordered in ED Medications  bacitracin ointment 1 application (not administered)     Initial Impression / Assessment and Plan / ED Course  I have reviewed the triage vital signs and the nursing notes.  Pertinent labs & imaging results that were available during my care of the patient were reviewed by me and considered in my medical decision making (see chart for details).     Vitals:   05/30/17 0054 05/30/17 0100 05/30/17 0701  BP: 132/78  (!) 144/88  Pulse: 92  68  Resp: 18  18  Temp: 97.8 F (36.6 C)    TempSrc: Oral    SpO2: 96%  100%  Weight:  72.6 kg (160 lb)   Height:  5\' 9"  (1.753 m)      [name] is 32 y.o. male presenting with mild shortness of breath, lung sounds clear to auscultation, he saturating well on room air, no tachypnea or tachycardia.  He also has some partial-thickness abrasions to left hand and right eye.  No clinical signs of entrapment.  He has some mild swelling  on the left hand.  Like to image maxillofacial and left hand however patient declines, he is in police custody and police state that he has capacity to do fine treatment.  He is clinically not intoxicated, can discuss pros and cons of treatment versus leaving and he has capacity for medical decision making.  He is invited to return to the ED at any time for further workup.  Evaluation does not show pathology that would require ongoing emergent intervention or inpatient treatment. Pt is hemodynamically stable and mentating appropriately. Discussed findings and plan with patient/guardian, who agrees with care plan. All questions answered. Return precautions discussed and outpatient follow up given.  Final Clinical Impressions(s) / ED Diagnoses   Final diagnoses:  Abrasions of multiple sites  Facial trauma, initial encounter  Contusion of left hand, initial encounter    ED Discharge Orders    None       Lynetta Mare Mardella Layman 05/30/17 1610    Azalia Bilis, MD 05/30/17 [phone]

## 2018-01-19 ENCOUNTER — Encounter (HOSPITAL_COMMUNITY): Payer: Self-pay

## 2018-01-19 ENCOUNTER — Emergency Department (HOSPITAL_COMMUNITY): Payer: Self-pay

## 2018-01-19 ENCOUNTER — Other Ambulatory Visit: Payer: Self-pay

## 2018-01-19 ENCOUNTER — Observation Stay (HOSPITAL_COMMUNITY)
Admission: EM | Admit: 2018-01-19 | Discharge: 2018-01-20 | Disposition: A | Discharge status: Home / Self Care | Payer: Self-pay | Attending: Internal Medicine | Admitting: Internal Medicine

## 2018-01-19 DIAGNOSIS — Z79899 Other long term (current) drug therapy: Secondary | ICD-10-CM | POA: Insufficient documentation

## 2018-01-19 DIAGNOSIS — E876 Hypokalemia: Secondary | ICD-10-CM | POA: Insufficient documentation

## 2018-01-19 DIAGNOSIS — J4521 Mild intermittent asthma with (acute) exacerbation: Secondary | ICD-10-CM

## 2018-01-19 DIAGNOSIS — J449 Chronic obstructive pulmonary disease, unspecified: Secondary | ICD-10-CM | POA: Insufficient documentation

## 2018-01-19 DIAGNOSIS — Z87891 Personal history of nicotine dependence: Secondary | ICD-10-CM | POA: Insufficient documentation

## 2018-01-19 DIAGNOSIS — J4551 Severe persistent asthma with (acute) exacerbation: Principal | ICD-10-CM | POA: Insufficient documentation

## 2018-01-19 DIAGNOSIS — N179 Acute kidney failure, unspecified: Secondary | ICD-10-CM | POA: Insufficient documentation

## 2018-01-19 DIAGNOSIS — J45901 Unspecified asthma with (acute) exacerbation: Secondary | ICD-10-CM | POA: Diagnosis present

## 2018-01-19 DIAGNOSIS — R000 Tachycardia, unspecified: Secondary | ICD-10-CM | POA: Insufficient documentation

## 2018-01-19 LAB — BASIC METABOLIC PANEL
Anion gap: 14 (ref 5–15)
BUN: 11 mg/dL (ref 6–20)
CHLORIDE: 103 mmol/L (ref 98–111)
CO2: 25 mmol/L (ref 22–32)
CREATININE: 1.13 mg/dL (ref 0.61–1.24)
Calcium: 9.5 mg/dL (ref 8.9–10.3)
GFR calc Af Amer: >60 mL/min (ref >60)
GFR calc non Af Amer: >60 mL/min (ref >60)
Glucose, Bld: 151 mg/dL — ABNORMAL HIGH (ref 70–99)
POTASSIUM: 3.4 mmol/L — AB (ref 3.5–5.1)
SODIUM: 142 mmol/L (ref 135–145)

## 2018-01-19 LAB — CBC
HEMATOCRIT: 50.4 % (ref 39.0–52.0)
HEMOGLOBIN: 17.4 g/dL — AB (ref 13.0–17.0)
MCH: 30.8 pg (ref 26.0–34.0)
MCHC: 34.5 g/dL (ref 30.0–36.0)
MCV: 89.2 fL (ref 78.0–100.0)
Platelets: 189 10*3/uL (ref 150–400)
RBC: 5.65 MIL/uL (ref 4.22–5.81)
RDW: 14.3 % (ref 11.5–15.5)
WBC: 10.5 10*3/uL (ref 4.0–10.5)

## 2018-01-19 MED ORDER — LORATADINE 10 MG PO TABS
10.0000 mg | ORAL_TABLET | Freq: Every day | ORAL | Status: DC
  Administered 2018-01-19 – 2018-01-20 (×2): 10 mg via ORAL
  Filled 2018-01-19 (×2): qty 1

## 2018-01-19 MED ORDER — ACETAMINOPHEN 650 MG RE SUPP: 650.0000 mg | Freq: Four times a day (QID) | RECTAL | Status: DC | PRN

## 2018-01-19 MED ORDER — AZITHROMYCIN 250 MG PO TABS
500.0000 mg | ORAL_TABLET | Freq: Every day | ORAL | Status: DC
  Administered 2018-01-19 – 2018-01-20 (×2): 500 mg via ORAL
  Filled 2018-01-19 (×2): qty 2

## 2018-01-19 MED ORDER — ONDANSETRON HCL 4 MG PO TABS
4.0000 mg | ORAL_TABLET | Freq: Four times a day (QID) | ORAL | Status: DC | PRN
  Administered 2018-01-19: 4 mg via ORAL
  Filled 2018-01-19: qty 1

## 2018-01-19 MED ORDER — PANTOPRAZOLE SODIUM 20 MG PO TBEC
20.0000 mg | DELAYED_RELEASE_TABLET | Freq: Every day | ORAL | Status: DC
  Administered 2018-01-19 – 2018-01-20 (×2): 20 mg via ORAL
  Filled 2018-01-19 (×2): qty 1

## 2018-01-19 MED ORDER — ALBUTEROL (5 MG/ML) CONTINUOUS INHALATION SOLN
10.0000 mg/h | INHALATION_SOLUTION | RESPIRATORY_TRACT | Status: DC
  Administered 2018-01-19: 10 mg/h via RESPIRATORY_TRACT
  Filled 2018-01-19: qty 20

## 2018-01-19 MED ORDER — IPRATROPIUM BROMIDE 0.02 % IN SOLN: 0.5000 mg | Freq: Four times a day (QID) | RESPIRATORY_TRACT | Status: DC

## 2018-01-19 MED ORDER — ENSURE ENLIVE PO LIQD
237.0000 mL | Freq: Two times a day (BID) | ORAL | Status: DC
  Administered 2018-01-19 – 2018-01-20 (×2): 237 mL via ORAL

## 2018-01-19 MED ORDER — IPRATROPIUM-ALBUTEROL 0.5-2.5 (3) MG/3ML IN SOLN
3.0000 mL | RESPIRATORY_TRACT | Status: DC
  Administered 2018-01-19 – 2018-01-20 (×5): 3 mL via RESPIRATORY_TRACT
  Filled 2018-01-19 (×5): qty 3

## 2018-01-19 MED ORDER — OXYCODONE-ACETAMINOPHEN 5-325 MG PO TABS: 1.0000 | ORAL_TABLET | ORAL | Status: DC | PRN

## 2018-01-19 MED ORDER — ACETAMINOPHEN 325 MG PO TABS: 650.0000 mg | ORAL_TABLET | Freq: Four times a day (QID) | ORAL | Status: DC | PRN

## 2018-01-19 MED ORDER — HYDROCODONE-ACETAMINOPHEN 5-325 MG PO TABS
1.0000 | ORAL_TABLET | ORAL | Status: DC | PRN
  Administered 2018-01-19: 1 via ORAL
  Filled 2018-01-19: qty 1

## 2018-01-19 MED ORDER — ALBUTEROL SULFATE (2.5 MG/3ML) 0.083% IN NEBU: 2.5000 mg | INHALATION_SOLUTION | RESPIRATORY_TRACT | Status: DC | PRN

## 2018-01-19 MED ORDER — FLUTICASONE PROPIONATE 50 MCG/ACT NA SUSP
1.0000 | Freq: Every day | NASAL | Status: DC
  Filled 2018-01-19: qty 16

## 2018-01-19 MED ORDER — METHYLPREDNISOLONE SODIUM SUCC 125 MG IJ SOLR
125.0000 mg | Freq: Once | INTRAMUSCULAR | Status: AC
Start: 2018-01-19 — End: 2018-01-19
  Administered 2018-01-19: 125 mg via INTRAVENOUS
  Filled 2018-01-19: qty 2

## 2018-01-19 MED ORDER — IPRATROPIUM BROMIDE 0.02 % IN SOLN
0.5000 mg | Freq: Once | RESPIRATORY_TRACT | Status: AC
  Administered 2018-01-19: 0.5 mg via RESPIRATORY_TRACT

## 2018-01-19 MED ORDER — METHYLPREDNISOLONE SODIUM SUCC 125 MG IJ SOLR
60.0000 mg | Freq: Four times a day (QID) | INTRAMUSCULAR | Status: DC
  Administered 2018-01-19 – 2018-01-20 (×3): 60 mg via INTRAVENOUS
  Filled 2018-01-19 (×3): qty 2

## 2018-01-19 MED ORDER — ALBUTEROL SULFATE (2.5 MG/3ML) 0.083% IN NEBU
5.0000 mg | INHALATION_SOLUTION | Freq: Once | RESPIRATORY_TRACT | Status: AC
  Administered 2018-01-19: 5 mg via RESPIRATORY_TRACT
  Filled 2018-01-19: qty 6

## 2018-01-19 MED ORDER — ONDANSETRON HCL 4 MG/2ML IJ SOLN: 4.0000 mg | Freq: Four times a day (QID) | INTRAMUSCULAR | Status: DC | PRN

## 2018-01-19 MED ORDER — SODIUM CHLORIDE 0.9 % IV SOLN
INTRAVENOUS | Status: AC
  Administered 2018-01-19: 17:00:00 via INTRAVENOUS

## 2018-01-19 MED ORDER — ALBUTEROL SULFATE (2.5 MG/3ML) 0.083% IN NEBU: 5.0000 mg | INHALATION_SOLUTION | Freq: Once | RESPIRATORY_TRACT | Status: DC

## 2018-01-19 NOTE — ED Notes (Signed)
Bed: [id number] Expected date:  Expected time:  Means of arrival:  Comments: 32 yo asthma

## 2018-01-19 NOTE — ED Notes (Signed)
ED TO INPATIENT HANDOFF REPORT  Name/Age/Gender [name] 32 y.o. male  Code Status Code Status History    Date Active Date Inactive Code Status Order ID Comments User Context   09/14/2016 1335 09/15/2016 2055 Full Code [id number]  Johnney Ou ED   02/01/2015 2350 02/03/2015 1347 Full Code [id number]  Reubin Milan, MD Inpatient      Home/SNF/Other Home  Chief Complaint shortness of breath   Level of Care/Admitting Diagnosis ED Disposition    ED Disposition Condition [facility] Area: [place] [[id number]]  Level of Care: Telemetry [5]  Admit to tele based on following criteria: Other see comments  Comments: hypoxia  Diagnosis: Acute asthma exacerbation [353614]  Admitting Physician: RAI, [place] K [[id number]]  Attending Physician: RAI, RIPUDEEP K [[id number]]  PT Class (Do Not Modify): Observation [104]  PT Acc Code (Do Not Modify): Observation [10022]       Medical History Past Medical History:  Diagnosis Date  . Asthma   . Polysubstance abuse ([place])     Allergies No Known Allergies  IV Location/Drains/Wounds Patient Lines/Drains/Airways Status   Active Line/Drains/Airways    None          Labs/Imaging Results for orders placed or performed during the hospital encounter of 01/19/18 (from the past 48 hour(s))  CBC     Status: Abnormal   Collection Time: 01/19/18 12:55 PM  Result Value Ref Range   WBC 10.5 4.0 - 10.5 K/uL   RBC 5.65 4.22 - 5.81 MIL/uL   Hemoglobin 17.4 (H) 13.0 - 17.0 g/dL   HCT 50.4 39.0 - 52.0 %   MCV 89.2 78.0 - 100.0 fL   MCH 30.8 26.0 - 34.0 pg   MCHC 34.5 30.0 - 36.0 g/dL   RDW 14.3 11.5 - 15.5 %   Platelets 189 150 - 400 K/uL    Comment: Performed at [facility], [place] [address], [place], [place] [address]  Basic metabolic panel     Status: Abnormal   Collection Time: 01/19/18 12:55 PM  Result Value Ref Range   Sodium 142 135 - 145 mmol/L   Potassium 3.4  (L) 3.5 - 5.1 mmol/L   Chloride 103 98 - 111 mmol/L   CO2 25 22 - 32 mmol/L   Glucose, Bld 151 (H) 70 - 99 mg/dL   BUN 11 6 - 20 mg/dL   Creatinine, Ser 1.13 0.61 - 1.24 mg/dL   Calcium 9.5 8.9 - 10.3 mg/dL   GFR calc non Af Amer >60 >60 mL/min   GFR calc Af Amer >60 >60 mL/min    Comment: (NOTE) The eGFR has been calculated using the CKD EPI equation. This calculation has not been validated in all clinical situations. eGFR's persistently <60 mL/min signify possible Chronic Kidney Disease.    Anion gap 14 5 - 15    Comment: Performed at [facility], [place] [address] [place], [place] [address]   Dg Chest Port 1 View  Result Date: 01/19/2018 CLINICAL DATA:  Dyspnea EXAM: PORTABLE CHEST 1 VIEW COMPARISON:  09/14/2016 FINDINGS: Cardiac shadow is stable. The lungs are hyperinflated consistent with the underlying history of asthma. No acute bony abnormality is seen. No focal infiltrate is noted. IMPRESSION: Hyperinflation consistent with history of asthma. No acute abnormality noted. Electronically Signed   By: Inez Catalina M.D.   On: 01/19/2018 13:14    Pending Labs [employer] (From admission, onward)    Start  Ordered   Signed and Held  HIV antibody (Routine Testing)  Once,   R     Signed and Held   Signed and Held  Basic metabolic panel  Tomorrow morning,   R     Signed and Held   Signed and Held  CBC  Tomorrow morning,   R     Signed and Held          Vitals/Pain Today's Vitals   01/19/18 1400 01/19/18 1430 01/19/18 1500 01/19/18 1530  BP: 112/64 127/70 129/69 123/77  Pulse: (!) 126 (!) 111 (!) 108 (!) 126  Resp: (!) 24 (!) 31 (!) 30 (!) 25  Temp:   99.9 F (37.7 C)   TempSrc:   Oral   SpO2: 94% 94% 94% 98%  Weight:        Isolation Precautions No active isolations  Medications Medications  albuterol (PROVENTIL) (2.5 MG/3ML) 0.083% nebulizer solution 5 mg (5 mg Nebulization Not Given 01/19/18 1151)  albuterol (PROVENTIL,VENTOLIN)  solution continuous neb (10 mg/hr Nebulization New Bag/Given 01/19/18 1151)  ipratropium (ATROVENT) nebulizer solution 0.5 mg (0.5 mg Nebulization Given 01/19/18 1151)  methylPREDNISolone sodium succinate (SOLU-MEDROL) 125 mg/2 mL injection 125 mg (125 mg Intravenous Given 01/19/18 1208)  albuterol (PROVENTIL) (2.5 MG/3ML) 0.083% nebulizer solution 5 mg (5 mg Nebulization Given 01/19/18 1515)    Mobility walks

## 2018-01-19 NOTE — ED Notes (Signed)
EDMD Ray at bedside

## 2018-01-19 NOTE — H&P (Signed)
History and Physical        Hospital Admission Note Date: 01/19/2018  Patient name: Jerry GivensBrandon T [name] Medical record number: [id number] Date of birth: 12/23/1985 Age: 32 y.o. Gender: male  PCP: Verlon AuBoyd, Tammy Lamonica, MD    Patient coming from: Home  I have [facility] all [facility].    Chief Complaint:  Wheezing since yesterday  HPI: Patient is a 32 year old male with a history of asthma since childhood presented to ED with increasing shortness of breath and wheezing that began yesterday.  Per patient he was in his usual state of health until yesterday evening he started to have the symptoms of cold and congestion.  He tried TheraFlu with no significant relief.  Overnight, he became more short of breath and started wheezing.  He used albuterol nebs twice last night and today with no significant relief.  He denies any fever chills, mild coughing, nausea vomiting or abdominal pain. States that he usually gets asthma exacerbation once a year when the weather changes.  He has never been intubated.   ED work-up/course:  Temp nine 9.9, respiratory rate 30, pulse 118, BP 129/69, O2 sats 94% on room air  Review of Systems: Positives marked in 'bold' Constitutional: Denies fever, chills, diaphoresis, poor appetite and fatigue.  HEENT: Denies photophobia, eye pain, redness, hearing loss, ear pain, congestion, sore throat, rhinorrhea, sneezing, mouth sores, trouble swallowing, neck pain, neck stiffness and tinnitus.   Respiratory: Please see HPI Cardiovascular: Denies chest pain, palpitations and leg swelling.  Gastrointestinal: Denies nausea, vomiting, abdominal pain, diarrhea, constipation, blood in stool and abdominal distention.  Genitourinary: Denies dysuria, urgency, frequency, hematuria, flank pain and difficulty urinating.  Musculoskeletal: Denies  myalgias, back pain, joint swelling, arthralgias and gait problem.  Skin: Denies pallor, rash and wound.  Neurological: Denies dizziness, seizures, syncope, weakness, light-headedness, numbness and headaches.  Hematological: Denies adenopathy. Easy bruising, personal or family bleeding history  Psychiatric/Behavioral: Denies suicidal ideation, mood changes, confusion, nervousness, sleep disturbance and agitation  Past Medical History: Past Medical History:  Diagnosis Date  . Asthma   . Polysubstance abuse [facility](HCC)     Past Surgical History:  Procedure Laterality Date  . EYE SURGERY      Medications: Prior to Admission medications   Medication Sig Start Date End Date Taking? Authorizing Provider  albuterol (PROAIR HFA) 108 (90 BASE) MCG/ACT inhaler Inhale 2 puffs into the lungs every 4 (four) hours as needed for wheezing or shortness of breath. 05/07/15  Yes Nyoka CowdenWert, Michael B, MD  ipratropium-albuterol (DUONEB) 0.5-2.5 (3) MG/3ML SOLN Take 3 mLs by nebulization every 6 (six) hours. Patient taking differently: Take 3 mLs by nebulization every 6 (six) hours as needed (wheezing).  02/03/15  Yes Mikhail, Maryann, DO  pantoprazole (PROTONIX) 20 MG tablet Take 20 mg by mouth daily.   Yes [provider]    Allergies:  No Known Allergies  Social History:  reports that he quit smoking about 9 years ago. His smoking use included cigarettes. He has a 2.50 pack-year smoking history. He has never used smokeless tobacco. He reports that he drinks alcohol. He reports that he has current or past drug history. Drug:  Marijuana.  Family History: Family History  Problem Relation Age of Onset  . Diabetes Father   . Asthma Father   . Hypertension Father   . Allergies Child   . Asthma Child   . Asthma Child     Physical Exam: Blood pressure 129/69, pulse (!) 108, temperature 99.9 F (37.7 C), temperature source Oral, resp. rate (!) 30, weight 72.6 kg, SpO2 94 %. General: Alert, awake,  oriented x3, in no acute distress, able to converse but has audible wheezing Eyes: pink conjunctiva,anicteric sclera, pupils equal and reactive to light and accomodation, HEENT: normocephalic, atraumatic, oropharynx clear Neck: supple, no masses or lymphadenopathy, no goiter, no bruits, no JVD CVS: Regular rate and rhythm, without murmurs, rubs or gallops. No lower extremity edema Resp : Bilateral diffuse expiratory wheezing GI : Soft, nontender, nondistended, positive bowel sounds, no masses. No hepatomegaly. No hernia.  Musculoskeletal: No clubbing or cyanosis, positive pedal pulses. No contracture. ROM intact  Neuro: Grossly intact, no focal neurological deficits, strength 5/5 upper and lower extremities bilaterally Psych: alert and oriented x 3, normal mood and affect Skin: no rashes or lesions, warm and dry   LABS on Admission: I have personally reviewed all the labs and imagings below    Basic Metabolic Panel: Recent Labs  Lab 01/19/18 1255  NA 142  K 3.4*  CL 103  CO2 25  GLUCOSE 151*  BUN 11  CREATININE 1.13  CALCIUM 9.5   Liver Function Tests: No results for input(s): AST, ALT, ALKPHOS, BILITOT, PROT, ALBUMIN in the last 168 hours. No results for input(s): LIPASE, AMYLASE in the last 168 hours. No results for input(s): AMMONIA in the last 168 hours. CBC: Recent Labs  Lab 01/19/18 1255  WBC 10.5  HGB 17.4*  HCT 50.4  MCV 89.2  PLT 189   Cardiac Enzymes: No results for input(s): CKTOTAL, CKMB, CKMBINDEX, TROPONINI in the last 168 hours. BNP: Invalid input(s): POCBNP CBG: No results for input(s): GLUCAP in the last 168 hours.  Radiological Exams on Admission:  Dg Chest Port 1 View  Result Date: 01/19/2018 CLINICAL DATA:  Dyspnea EXAM: PORTABLE CHEST 1 VIEW COMPARISON:  09/14/2016 FINDINGS: Cardiac shadow is stable. The lungs are hyperinflated consistent with the underlying history of asthma. No acute bony abnormality is seen. No focal infiltrate is noted.  IMPRESSION: Hyperinflation consistent with history of asthma. No acute abnormality noted. Electronically Signed   By: Alcide Clever M.D.   On: 01/19/2018 13:14      EKG: Independently reviewed.  Rate 110 with a sinus tachycardia   Assessment/Plan Principal Problem: Acute asthma exacerbation -Patient has received prolonged nebs treatment in the ED with Solu-Medrol 125 mg IV x1 with no significant relief although feeling somewhat better -Admit for observation, placed on scheduled duo nebs, flutter valve, Zithromax, Solu-Medrol 60 mg IV every 6 hours  Active Problems: Sinus tachycardia -Likely due to acute asthma exacerbation and albuterol.  No chest pain.  Occasional alcohol use -Per patient he drinks alcohol occasionally, monitor closely for any withdrawals.  DVT prophylaxis: SCDs  CODE STATUS: Full code  Consults called: None  Family Communication: Admission, patients condition and plan of care including tests being ordered have been discussed with the patient who indicates understanding and agree with the plan and Code Status  Admission status: Observation, telemetry  Disposition plan: Further plan will depend as patient's clinical course evolves and further radiologic and laboratory data become available.     The medical decision making on this patient was  of high complexity and the patient is at high risk for clinical deterioration, therefore this is a level 3 visit.   Time Spent on Admission: 65 minutes    Shayda Kalka M.D. [facility] 01/19/2018, 3:20 PM Pager: [phone]  If 7PM-7AM, please contact night-coverage [email or web address] Password [email or web address]

## 2018-01-19 NOTE — ED Triage Notes (Signed)
Patient presents via EMS from home. Hx of Asthma. Patient reports using home nebs and inhaler without relief. Fire administered 5mg  Albuterol neb, without relief. EMS started patient Duoneb , bringing patient to 92% on oxygen. 20g Left forearm PIV placed by EMS- 2gm MagSulfate IV and 125mg  Solumedrol IV administered by EMS. Patient on 2nd Duoneb in triage via EMS. Patient reports previous asthma exacerbations requiring CPAP.  Respiratory called to patient bedside in triage.

## 2018-01-19 NOTE — ED Provider Notes (Signed)
[facility]-EMERGENCY DEPT Provider Note   CSN: [id number] Arrival date & time: 01/19/18  1136     History   Chief Complaint Chief Complaint  Patient presents with  . Shortness of Breath    HPI [name] is a 32 y.o. male.  HPI  32 year old male history of asthma presents today with increased dyspnea and wheezing that began last night.  He has used his albuterol twice at home.  He was treated prehospital with nebulizer and Solu-Medrol by EMS.  He reports nasal congestion and cough.  He reports he gets this annually when the "weather changes".  He is significantly dyspneic. Past Medical History:  Diagnosis Date  . Asthma   . Polysubstance abuse [facility](HCC)     Patient Active Problem List   Diagnosis Date Noted  . Acute respiratory failure with hypoxia (HCC) 09/14/2016  . Signs and symptoms of severe respiratory distress   . Tachycardia   . Dyspnea 05/07/2015  . 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 Polysubstance abuse (HCC)   . AKI (acute kidney injury) (HCC)   . Asthma attack 02/01/2015  . Asthma exacerbation 02/01/2015  . Hypokalemia 02/01/2015    Past Surgical History:  Procedure Laterality Date  . EYE SURGERY          Home Medications    Prior to Admission medications   Medication Sig Start Date End Date Taking? Authorizing Provider  albuterol (PROAIR HFA) 108 (90 BASE) MCG/ACT inhaler Inhale 2 puffs into the lungs every 4 (four) hours as needed for wheezing or shortness of breath. 05/07/15   Nyoka CowdenWert, Michael B, MD  ipratropium-albuterol (DUONEB) 0.5-2.5 (3) MG/3ML SOLN Take 3 mLs by nebulization every 6 (six) hours. 02/03/15   Edsel PetrinMikhail, Maryann, DO    Family History Family History  Problem Relation Age of Onset  . Diabetes Father   . Asthma Father   . Hypertension Father   . Allergies Child   . Asthma Child   . Asthma Child     Social History Social History   Tobacco Use  . Smoking status:  Former Smoker    Packs/day: 0.50    Years: 5.00    Pack years: 2.50    Types: Cigarettes    Last attempt to quit: 09/17/2008    Years since quitting: 9.3  . Smokeless tobacco: Never Used  Substance Use Topics  . Alcohol use: Yes    Comment: Vodka - 1/2 pint 2-3 times a week  . Drug use: Yes    Types: Marijuana     Allergies   Patient has no known allergies.   Review of Systems Review of Systems  All other systems reviewed and are negative.    Physical Exam Updated Vital Signs Wt 72.6 kg   BMI 23.64 kg/m   Physical Exam  Constitutional: He is oriented to person, place, and time. He appears well-developed and well-nourished. He appears ill. He appears distressed.  HENT:  Head: Normocephalic.  Mouth/Throat: Oropharynx is clear and moist.  Neck: Normal range of motion. Neck supple.  Cardiovascular: Normal rate.  Pulmonary/Chest: Accessory muscle usage present. Tachypnea noted. He is in respiratory distress. He has wheezes in the right middle field, the right lower field, the left middle field and the left lower field.  Abdominal: Soft. Bowel sounds are normal.  Musculoskeletal: Normal range of motion.       Right lower leg: Normal.       Left lower leg: Normal.  Neurological:  He is alert and oriented to person, place, and time.  Skin: Skin is warm and dry. Capillary refill takes less than 2 seconds.  Psychiatric: He has a normal mood and affect.  Nursing note and vitals reviewed.    ED Treatments / Results  Labs (all labs ordered are listed, but only abnormal results are displayed) Labs Reviewed - No data to display  EKG None  Radiology Dg Chest [facility] 1 View  Result Date: 01/19/2018 CLINICAL DATA:  Dyspnea EXAM: PORTABLE CHEST 1 VIEW COMPARISON:  09/14/2016 FINDINGS: Cardiac shadow is stable. The lungs are hyperinflated consistent with the underlying history of asthma. No acute bony abnormality is seen. No focal infiltrate is noted. IMPRESSION: Hyperinflation  consistent with history of asthma. No acute abnormality noted. Electronically Signed   By: Alcide Clever M.D.   On: 01/19/2018 13:14    Procedures .Critical Care Performed by: Margarita Grizzle, MD Authorized by: Margarita Grizzle, MD   Critical care provider statement:    Critical care time (minutes):  45   Critical care end time:  01/19/2018 3:06 PM   Critical care was time spent personally by me on the following activities:  Discussions with consultants, evaluation of patient's response to treatment, examination of patient, ordering and performing treatments and interventions, ordering and review of laboratory studies, ordering and review of radiographic studies, pulse oximetry, re-evaluation of patient's condition, obtaining history from patient or surrogate and review of old charts   (including critical care time)  Medications Ordered in ED Medications  albuterol (PROVENTIL) (2.5 MG/3ML) 0.083% nebulizer solution 5 mg (5 mg Nebulization Not Given 01/19/18 1151)  albuterol (PROVENTIL,VENTOLIN) solution continuous neb (10 mg/hr Nebulization New Bag/Given 01/19/18 1151)  methylPREDNISolone sodium succinate (SOLU-MEDROL) 125 mg/2 mL injection 125 mg (has no administration in time range)  ipratropium (ATROVENT) nebulizer solution 0.5 mg (0.5 mg Nebulization Given 01/19/18 1151)     Initial Impression / Assessment and Plan / ED Course  I have reviewed the triage vital signs and the nursing notes.  Pertinent labs & imaging results that were available during my care of the patient were reviewed by me and considered in my medical decision making (see chart for details).    1:12 PM Patient improved after nebs and solumedrol.  Speaking in full paragraphs without sats at 97%. Will observe 2:55 PM Patient continues tachycardiac, with inspiratory and expiratory wheezes, sats 93-94% at rest.  He has received total of 6 nebs over past several hours and 250 of solumedrol Plan observation - will consult  hospitalist Discussed with Dr.Rai and she will see in consult  Final Clinical Impressions(s) / ED Diagnoses   Final diagnoses:  Severe persistent asthma with exacerbation    ED Discharge Orders    None       Margarita Grizzle, MD 01/19/18 1506

## 2018-01-20 DIAGNOSIS — J4551 Severe persistent asthma with (acute) exacerbation: Principal | ICD-10-CM

## 2018-01-20 DIAGNOSIS — R000 Tachycardia, unspecified: Secondary | ICD-10-CM

## 2018-01-20 LAB — BASIC METABOLIC PANEL
Anion gap: 12 (ref 5–15)
BUN: 13 mg/dL (ref 6–20)
CO2: 25 mmol/L (ref 22–32)
CREATININE: 0.88 mg/dL (ref 0.61–1.24)
Calcium: 9.5 mg/dL (ref 8.9–10.3)
Chloride: 103 mmol/L (ref 98–111)
GFR calc Af Amer: >60 mL/min (ref >60)
GLUCOSE: 142 mg/dL — AB (ref 70–99)
Potassium: 4.2 mmol/L (ref 3.5–5.1)
SODIUM: 140 mmol/L (ref 135–145)

## 2018-01-20 LAB — CBC
HCT: 45.3 % (ref 39.0–52.0)
Hemoglobin: 15.8 g/dL (ref 13.0–17.0)
MCH: 30.6 pg (ref 26.0–34.0)
MCHC: 34.9 g/dL (ref 30.0–36.0)
MCV: 87.8 fL (ref 78.0–100.0)
PLATELETS: 197 10*3/uL (ref 150–400)
RBC: 5.16 MIL/uL (ref 4.22–5.81)
RDW: 14.4 % (ref 11.5–15.5)
WBC: 15.5 10*3/uL — AB (ref 4.0–10.5)

## 2018-01-20 LAB — HIV ANTIBODY (ROUTINE TESTING W REFLEX): HIV SCREEN 4TH GENERATION: NONREACTIVE

## 2018-01-20 MED ORDER — OXYCODONE-ACETAMINOPHEN 5-325 MG PO TABS: 1.0000 | ORAL_TABLET | ORAL | Status: DC | PRN

## 2018-01-20 MED ORDER — PREDNISONE 10 MG PO TABS: 10.0000 mg | ORAL_TABLET | Freq: Every day | ORAL | Status: AC

## 2018-01-20 MED ORDER — AZITHROMYCIN 250 MG PO TABS: ORAL_TABLET | ORAL | 0 refills | Status: DC

## 2018-01-20 NOTE — Discharge Summary (Signed)
Physician Discharge Summary  Jerry GivensBrandon T [name] VHQ:[id number] DOB: 1985/06/13 DOA: 01/19/2018  PCP: Verlon AuBoyd, Tammy Lamonica, MD  Admit date: 01/19/2018 Discharge date: 01/20/2018  Admitted From: Home Disposition:  Home  Recommendations for Outpatient Follow-up:  1. Follow up with PCP in 1-2 weeks  Discharge Condition:Improved CODE STATUS:Full Diet recommendation: Regular   Brief/Interim Summary: 32 year old male with a history of asthma since childhood presented to ED with increasing shortness of breath and wheezing that began yesterday.  Per patient he was in his usual state of health until yesterday evening he started to have the symptoms of cold and congestion.  He tried TheraFlu with no significant relief.  Overnight, he became more short of breath and started wheezing.  He used albuterol nebs twice last night and today with no significant relief.  He denies any fever chills, mild coughing, nausea vomiting or abdominal pain. States that he usually gets asthma exacerbation once a year when the weather changes.  He has never been intubated.  ED work-up/course:  Temp nine 9.9, respiratory rate 30, pulse 118, BP 129/69, O2 sats 94% on room air  Principal Problem: Acute asthma exacerbation -Much improved with IV solumedrol and neb treatments -Able to ambulate briskly in the hallway on room air maintaining O2 sats -Will discharge home with prednisone taper  Active Problems: Sinus tachycardia -Likely due to acute asthma exacerbation and albuterol.  No chest pain. -Resolved  Occasional alcohol use -Per patient he drinks alcohol occasionally, no evidence of withdrawals   Discharge Diagnoses:  Principal Problem:   Asthma exacerbation Active Problems:   Tachycardia   Acute asthma exacerbation    Discharge Instructions   Allergies as of 01/20/2018   No Known Allergies     Medication List    TAKE these medications   albuterol 108 (90 Base) MCG/ACT inhaler Commonly  known as:  PROVENTIL HFA;VENTOLIN HFA Inhale 2 puffs into the lungs every 4 (four) hours as needed for wheezing or shortness of breath.   azithromycin 250 MG tablet Commonly known as:  ZITHROMAX 1 tab po daily x 4 more days Start taking on:  01/21/2018   ipratropium-albuterol 0.5-2.5 (3) MG/3ML Soln Commonly known as:  DUONEB Take 3 mLs by nebulization every 6 (six) hours. What changed:    when to take this  reasons to take this   pantoprazole 20 MG tablet Commonly known as:  PROTONIX Take 20 mg by mouth daily.   predniSONE 10 MG tablet Commonly known as:  DELTASONE Take 1 tablet (10 mg total) by mouth daily. Taper dose: 40mg  po daily x 2 days, then 20mg  po daily x 2 days, then 10mg  po daily x 2 days, then 5mg  po daily x 2 days, then stop. Zero refills      Follow-up Information    Verlon AuBoyd, Tammy Lamonica, MD Follow up.   Specialty:  Family Medicine Contact information: [address] [facility] [place] [address] [phone]          No Known Allergies   Procedures/Studies: Dg Chest Port 1 View  Result Date: 01/19/2018 CLINICAL DATA:  Dyspnea EXAM: PORTABLE CHEST 1 VIEW COMPARISON:  09/14/2016 FINDINGS: Cardiac shadow is stable. The lungs are hyperinflated consistent with the underlying history of asthma. No acute bony abnormality is seen. No focal infiltrate is noted. IMPRESSION: Hyperinflation consistent with history of asthma. No acute abnormality noted. Electronically Signed   By: Alcide CleverMark  Lukens M.D.   On: 01/19/2018 13:14    Subjective: Eager to go home  Discharge Exam: Vitals:   01/20/18 0503 01/20/18 0831  BP: 128/68   Pulse: 69   Resp: 18   Temp: 98 F (36.7 C)   SpO2: 91% 96%   Vitals:   01/19/18 2351 01/20/18 0407 01/20/18 0503 01/20/18 0831  BP:   128/68   Pulse:   69   Resp:   18   Temp:   98 F (36.7 C)   TempSrc:   Oral   SpO2: 93% 93% 91% 96%  Weight:      Height:        General: Pt is alert, awake, not in  acute distress Cardiovascular: RRR, S1/S2 +, no rubs, no gallops Respiratory: CTA bilaterally, no wheezing, no rhonchi Abdominal: Soft, NT, ND, bowel sounds + Extremities: no edema, no cyanosis   The results of significant diagnostics from this hospitalization (including imaging, microbiology, ancillary and laboratory) are listed below for reference.     Microbiology: No results found for this or any previous visit (from the past 240 hour(s)).   Labs: BNP (last 3 results) No results for input(s): BNP in the last 8760 hours. Basic Metabolic Panel: Recent Labs  Lab 01/19/18 1255 01/20/18 0356  NA 142 140  K 3.4* 4.2  CL 103 103  CO2 25 25  GLUCOSE 151* 142*  BUN 11 13  CREATININE 1.13 0.88  CALCIUM 9.5 9.5   Liver Function Tests: No results for input(s): AST, ALT, ALKPHOS, BILITOT, PROT, ALBUMIN in the last 168 hours. No results for input(s): LIPASE, AMYLASE in the last 168 hours. No results for input(s): AMMONIA in the last 168 hours. CBC: Recent Labs  Lab 01/19/18 1255 01/20/18 0356  WBC 10.5 15.5*  HGB 17.4* 15.8  HCT 50.4 45.3  MCV 89.2 87.8  PLT 189 197   Cardiac Enzymes: No results for input(s): CKTOTAL, CKMB, CKMBINDEX, TROPONINI in the last 168 hours. BNP: Invalid input(s): POCBNP CBG: No results for input(s): GLUCAP in the last 168 hours. D-Dimer No results for input(s): DDIMER in the last 72 hours. Hgb A1c No results for input(s): HGBA1C in the last 72 hours. Lipid Profile No results for input(s): CHOL, HDL, LDLCALC, TRIG, CHOLHDL, LDLDIRECT in the last 72 hours. Thyroid function studies No results for input(s): TSH, T4TOTAL, T3FREE, THYROIDAB in the last 72 hours.  Invalid input(s): FREET3 Anemia work up No results for input(s): VITAMINB12, FOLATE, FERRITIN, TIBC, IRON, RETICCTPCT in the last 72 hours. Urinalysis    Component Value Date/Time   COLORURINE YELLOW 09/20/2012 0910   APPEARANCEUR CLEAR 09/20/2012 0910   LABSPEC 1.023 09/20/2012  0910   PHURINE 7.5 09/20/2012 0910   GLUCOSEU NEGATIVE 09/20/2012 0910   HGBUR NEGATIVE 09/20/2012 0910   BILIRUBINUR NEGATIVE 09/20/2012 0910   KETONESUR NEGATIVE 09/20/2012 0910   PROTEINUR NEGATIVE 09/20/2012 0910   UROBILINOGEN 0.2 09/20/2012 0910   NITRITE NEGATIVE 09/20/2012 0910   LEUKOCYTESUR SMALL (A) 09/20/2012 0910   Sepsis Labs Invalid input(s): PROCALCITONIN,  WBC,  LACTICIDVEN Microbiology No results found for this or any previous visit (from the past 240 hour(s)).  Time spent:  SIGNED:   Rickey Barbara, MD  [facility] 01/20/2018, 10:54 AM  If 7PM-7AM, please contact night-coverage

## 2018-01-20 NOTE — Progress Notes (Addendum)
Pt ambulated 360 feet around unit fast pace --RA O2 sat 92%. SRP, RN

## 2018-01-20 NOTE — Progress Notes (Signed)
Discharged to home, instruction reviewed with patient, acknowledged understanding. Srp, RN

## 2019-09-01 ENCOUNTER — Emergency Department (HOSPITAL_COMMUNITY)
Admission: EM | Admit: 2019-09-01 | Discharge: 2019-09-02 | Disposition: A | Discharge status: Home / Self Care | Payer: Self-pay | Attending: Emergency Medicine | Admitting: Emergency Medicine

## 2019-09-01 ENCOUNTER — Emergency Department (HOSPITAL_COMMUNITY): Payer: Self-pay

## 2019-09-01 ENCOUNTER — Encounter (HOSPITAL_COMMUNITY): Payer: Self-pay | Admitting: Emergency Medicine

## 2019-09-01 DIAGNOSIS — Z23 Encounter for immunization: Secondary | ICD-10-CM | POA: Insufficient documentation

## 2019-09-01 DIAGNOSIS — S51012A Laceration without foreign body of left elbow, initial encounter: Secondary | ICD-10-CM | POA: Insufficient documentation

## 2019-09-01 DIAGNOSIS — S51812A Laceration without foreign body of left forearm, initial encounter: Secondary | ICD-10-CM | POA: Insufficient documentation

## 2019-09-01 DIAGNOSIS — S61215A Laceration without foreign body of left ring finger without damage to nail, initial encounter: Secondary | ICD-10-CM | POA: Insufficient documentation

## 2019-09-01 DIAGNOSIS — S61412A Laceration without foreign body of left hand, initial encounter: Secondary | ICD-10-CM | POA: Insufficient documentation

## 2019-09-01 DIAGNOSIS — T07XXXA Unspecified multiple injuries, initial encounter: Secondary | ICD-10-CM

## 2019-09-01 DIAGNOSIS — Y9389 Activity, other specified: Secondary | ICD-10-CM | POA: Insufficient documentation

## 2019-09-01 DIAGNOSIS — S61211A Laceration without foreign body of left index finger without damage to nail, initial encounter: Secondary | ICD-10-CM | POA: Insufficient documentation

## 2019-09-01 DIAGNOSIS — W010XXA Fall on same level from slipping, tripping and stumbling without subsequent striking against object, initial encounter: Secondary | ICD-10-CM | POA: Insufficient documentation

## 2019-09-01 DIAGNOSIS — Y929 Unspecified place or not applicable: Secondary | ICD-10-CM | POA: Insufficient documentation

## 2019-09-01 DIAGNOSIS — Y999 Unspecified external cause status: Secondary | ICD-10-CM | POA: Insufficient documentation

## 2019-09-01 DIAGNOSIS — W25XXXA Contact with sharp glass, initial encounter: Secondary | ICD-10-CM | POA: Insufficient documentation

## 2019-09-01 MED ORDER — LIDOCAINE HCL (PF) 1 % IJ SOLN
30.0000 mL | Freq: Once | INTRAMUSCULAR | Status: AC
  Administered 2019-09-02: 30 mL via INTRADERMAL
  Filled 2019-09-01: qty 30

## 2019-09-01 MED ORDER — HYDROCODONE-ACETAMINOPHEN 5-325 MG PO TABS
1.0000 | ORAL_TABLET | Freq: Once | ORAL | Status: AC
  Administered 2019-09-01: 20:00:00 1 via ORAL
  Filled 2019-09-01: qty 1

## 2019-09-01 MED ORDER — DOXYCYCLINE HYCLATE 100 MG PO TABS
100.0000 mg | ORAL_TABLET | Freq: Once | ORAL | Status: AC
  Administered 2019-09-02: 100 mg via ORAL
  Filled 2019-09-01: qty 1

## 2019-09-01 MED ORDER — TETANUS-DIPHTH-ACELL PERTUSSIS 5-2.5-18.5 LF-MCG/0.5 IM SUSP
0.5000 mL | Freq: Once | INTRAMUSCULAR | Status: AC
  Administered 2019-09-01: 20:00:00 0.5 mL via INTRAMUSCULAR
  Filled 2019-09-01: qty 0.5

## 2019-09-01 MED ORDER — BACITRACIN ZINC 500 UNIT/GM EX OINT: TOPICAL_OINTMENT | Freq: Once | CUTANEOUS | Status: AC

## 2019-09-01 NOTE — ED Notes (Signed)
Pt transported to xray 
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
## 2019-09-02 MED ORDER — DOXYCYCLINE HYCLATE 100 MG PO CAPS: 100.0000 mg | ORAL_CAPSULE | Freq: Two times a day (BID) | ORAL | 0 refills | Status: AC

## 2019-09-02 NOTE — ED Notes (Signed)
Patient verbalizes understanding of discharge instructions. Opportunity for questioning and answers were provided. Armband removed by staff, pt discharged from ED. Pt. ambulatory and discharged home.  

## 2019-09-02 NOTE — Discharge Instructions (Signed)
Keep the wound clean and dry for the first 24 hours. After that you may gently clean the wound with soap and water. Make sure to pat dry the wound before covering it with any dressing. You can use topical antibiotic ointment and bandage. Ice and elevate for pain relief.   You can take Tylenol or Ibuprofen as directed for pain. You can alternate Tylenol and Ibuprofen every 4 hours for additional pain relief.   Take antibiotics as directed. Please take all of your antibiotics until finished.  Return to the Emergency Department, your primary care doctor, or the [facility] in 7-10 days for suture removal.   Monitor closely for any signs of infection. Return to the Emergency Department for any worsening redness/swelling of the area that begins to spread, drainage from the site, worsening pain, fever or any other worsening or concerning symptoms.    

## 2021-02-25 ENCOUNTER — Other Ambulatory Visit: Payer: Self-pay

## 2021-02-25 ENCOUNTER — Encounter (HOSPITAL_BASED_OUTPATIENT_CLINIC_OR_DEPARTMENT_OTHER): Payer: Self-pay | Admitting: Emergency Medicine

## 2021-02-25 ENCOUNTER — Emergency Department (HOSPITAL_BASED_OUTPATIENT_CLINIC_OR_DEPARTMENT_OTHER)
Admission: EM | Admit: 2021-02-25 | Discharge: 2021-02-25 | Disposition: A | Discharge status: Home / Self Care | Payer: Self-pay | Attending: Emergency Medicine | Admitting: Emergency Medicine

## 2021-02-25 DIAGNOSIS — R519 Headache, unspecified: Secondary | ICD-10-CM | POA: Insufficient documentation

## 2021-02-25 DIAGNOSIS — J45909 Unspecified asthma, uncomplicated: Secondary | ICD-10-CM | POA: Insufficient documentation

## 2021-02-25 DIAGNOSIS — M79604 Pain in right leg: Secondary | ICD-10-CM | POA: Insufficient documentation

## 2021-02-25 DIAGNOSIS — M79602 Pain in left arm: Secondary | ICD-10-CM | POA: Insufficient documentation

## 2021-02-25 DIAGNOSIS — M79605 Pain in left leg: Secondary | ICD-10-CM | POA: Insufficient documentation

## 2021-02-25 DIAGNOSIS — Y92410 Unspecified street and highway as the place of occurrence of the external cause: Secondary | ICD-10-CM | POA: Insufficient documentation

## 2021-02-25 DIAGNOSIS — J449 Chronic obstructive pulmonary disease, unspecified: Secondary | ICD-10-CM | POA: Insufficient documentation

## 2021-02-25 DIAGNOSIS — Z87891 Personal history of nicotine dependence: Secondary | ICD-10-CM | POA: Insufficient documentation

## 2021-02-25 DIAGNOSIS — M79601 Pain in right arm: Secondary | ICD-10-CM | POA: Insufficient documentation

## 2021-02-25 MED ORDER — CYCLOBENZAPRINE HCL 10 MG PO TABS: 10.0000 mg | ORAL_TABLET | Freq: Two times a day (BID) | ORAL | 0 refills | Status: AC | PRN

## 2021-02-25 MED ORDER — IBUPROFEN 600 MG PO TABS: 600.0000 mg | ORAL_TABLET | Freq: Four times a day (QID) | ORAL | 0 refills | Status: AC | PRN
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
## 2021-10-21 ENCOUNTER — Encounter (HOSPITAL_BASED_OUTPATIENT_CLINIC_OR_DEPARTMENT_OTHER): Payer: Self-pay | Admitting: Emergency Medicine

## 2021-10-21 ENCOUNTER — Other Ambulatory Visit: Payer: Self-pay

## 2021-10-21 ENCOUNTER — Emergency Department (HOSPITAL_BASED_OUTPATIENT_CLINIC_OR_DEPARTMENT_OTHER)
Admission: EM | Admit: 2021-10-21 | Discharge: 2021-10-21 | Disposition: A | Discharge status: Home / Self Care | Payer: Self-pay | Attending: Emergency Medicine | Admitting: Emergency Medicine

## 2021-10-21 ENCOUNTER — Emergency Department (HOSPITAL_BASED_OUTPATIENT_CLINIC_OR_DEPARTMENT_OTHER): Payer: Self-pay

## 2021-10-21 DIAGNOSIS — W2201XA Walked into wall, initial encounter: Secondary | ICD-10-CM | POA: Insufficient documentation

## 2021-10-21 DIAGNOSIS — S62647A Nondisplaced fracture of proximal phalanx of left little finger, initial encounter for closed fracture: Secondary | ICD-10-CM | POA: Insufficient documentation

## 2021-10-21 MED ORDER — NAPROXEN 500 MG PO TABS: 500.0000 mg | ORAL_TABLET | Freq: Two times a day (BID) | ORAL | 0 refills | Status: AC

## 2021-10-21 MED ORDER — HYDROCODONE-ACETAMINOPHEN 5-325 MG PO TABS: 1.0000 | ORAL_TABLET | Freq: Four times a day (QID) | ORAL | 0 refills | Status: AC | PRN
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
## 2021-10-28 ENCOUNTER — Ambulatory Visit: Payer: Self-pay | Admitting: Orthopedic Surgery

## 2021-12-05 ENCOUNTER — Other Ambulatory Visit: Payer: Self-pay

## 2021-12-05 DIAGNOSIS — Z5321 Procedure and treatment not carried out due to patient leaving prior to being seen by health care provider: Secondary | ICD-10-CM | POA: Diagnosis not present

## 2021-12-05 DIAGNOSIS — R0602 Shortness of breath: Secondary | ICD-10-CM | POA: Insufficient documentation

## 2021-12-05 DIAGNOSIS — Y92410 Unspecified street and highway as the place of occurrence of the external cause: Secondary | ICD-10-CM | POA: Insufficient documentation

## 2021-12-05 DIAGNOSIS — J45909 Unspecified asthma, uncomplicated: Secondary | ICD-10-CM | POA: Insufficient documentation
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
## 2021-12-06 ENCOUNTER — Emergency Department (HOSPITAL_BASED_OUTPATIENT_CLINIC_OR_DEPARTMENT_OTHER)
Admission: EM | Admit: 2021-12-06 | Discharge: 2021-12-06 | Discharge status: Against Medical Advice | Payer: Self-pay | Attending: Emergency Medicine | Admitting: Emergency Medicine

## 2022-06-10 DIAGNOSIS — R569 Unspecified convulsions: Secondary | ICD-10-CM | POA: Diagnosis not present

## 2022-06-10 DIAGNOSIS — R0681 Apnea, not elsewhere classified: Secondary | ICD-10-CM | POA: Diagnosis not present

## 2022-09-28 ENCOUNTER — Other Ambulatory Visit: Payer: Self-pay | Admitting: Family

## 2022-09-28 DIAGNOSIS — R569 Unspecified convulsions: Secondary | ICD-10-CM

## 2022-11-02 ENCOUNTER — Inpatient Hospital Stay: Admission: RE | Admit: 2022-11-02 | Payer: Self-pay | Source: Ambulatory Visit

## 2022-12-02 ENCOUNTER — Ambulatory Visit
Admission: RE | Admit: 2022-12-02 | Discharge: 2022-12-02 | Disposition: A | Discharge status: Home / Self Care | Payer: PRIVATE HEALTH INSURANCE | Source: Ambulatory Visit | Attending: Family | Admitting: Family

## 2022-12-02 DIAGNOSIS — R569 Unspecified convulsions: Secondary | ICD-10-CM
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
